# Patient Record
Sex: Female | Born: 1990 | Race: Black or African American | Hispanic: No | Marital: Single | Smoking: Current some day smoker
Health system: Southern US, Community
[De-identification: ages and names within clinical notes are randomized; demographics above are authoritative.]

## PROBLEM LIST (undated history)

## (undated) ENCOUNTER — Inpatient Hospital Stay (HOSPITAL_COMMUNITY): Payer: Self-pay

## (undated) DIAGNOSIS — R87629 Unspecified abnormal cytological findings in specimens from vagina: Secondary | ICD-10-CM

## (undated) DIAGNOSIS — D649 Anemia, unspecified: Secondary | ICD-10-CM

## (undated) DIAGNOSIS — IMO0002 Reserved for concepts with insufficient information to code with codable children: Secondary | ICD-10-CM

## (undated) DIAGNOSIS — O0930 Supervision of pregnancy with insufficient antenatal care, unspecified trimester: Secondary | ICD-10-CM

## (undated) DIAGNOSIS — R87619 Unspecified abnormal cytological findings in specimens from cervix uteri: Secondary | ICD-10-CM

## (undated) DIAGNOSIS — Z87440 Personal history of urinary (tract) infections: Secondary | ICD-10-CM

## (undated) DIAGNOSIS — O2240 Hemorrhoids in pregnancy, unspecified trimester: Secondary | ICD-10-CM

## (undated) HISTORY — PX: NO PAST SURGERIES: SHX2092

## (undated) HISTORY — DX: Unspecified abnormal cytological findings in specimens from cervix uteri: R87.619

## (undated) HISTORY — DX: Hemorrhoids in pregnancy, unspecified trimester: O22.40

## (undated) HISTORY — DX: Supervision of pregnancy with insufficient antenatal care, unspecified trimester: O09.30

## (undated) HISTORY — DX: Anemia, unspecified: D64.9

## (undated) HISTORY — DX: Reserved for concepts with insufficient information to code with codable children: IMO0002

## (undated) HISTORY — DX: Unspecified abnormal cytological findings in specimens from vagina: R87.629

## (undated) HISTORY — DX: Personal history of urinary (tract) infections: Z87.440

---
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
## 2010-01-02 ENCOUNTER — Inpatient Hospital Stay (HOSPITAL_COMMUNITY): Admission: AD | Admit: 2010-01-02 | Discharge: 2010-01-04 | Payer: Self-pay | Admitting: Obstetrics & Gynecology

## 2010-01-02 ENCOUNTER — Ambulatory Visit: Payer: Self-pay | Admitting: Obstetrics and Gynecology

## 2010-08-27 LAB — URINALYSIS, DIPSTICK ONLY
Bilirubin Urine: NEGATIVE
Ketones, ur: NEGATIVE mg/dL
Nitrite: POSITIVE — AB
Specific Gravity, Urine: 1.015 (ref 1.005–1.030)
pH: 6 (ref 5.0–8.0)

## 2010-08-27 LAB — CBC
MCH: 26.3 pg (ref 26.0–34.0)
MCHC: 32.4 g/dL (ref 30.0–36.0)
MCV: 81.2 fL (ref 78.0–100.0)
Platelets: 264 10*3/uL (ref 150–400)

## 2010-08-27 LAB — RAPID URINE DRUG SCREEN, HOSP PERFORMED
Cocaine: NOT DETECTED
Tetrahydrocannabinol: POSITIVE — AB

## 2010-08-27 LAB — RPR: RPR Ser Ql: NONREACTIVE

## 2010-08-27 LAB — URINE CULTURE
Colony Count: NO GROWTH
Special Requests: NEGATIVE

## 2010-12-26 ENCOUNTER — Inpatient Hospital Stay (INDEPENDENT_AMBULATORY_CARE_PROVIDER_SITE_OTHER)
Admission: RE | Admit: 2010-12-26 | Discharge: 2010-12-26 | Disposition: A | Discharge status: Home / Self Care | Payer: Medicaid Other | Source: Ambulatory Visit | Attending: Family Medicine | Admitting: Family Medicine

## 2010-12-26 DIAGNOSIS — H109 Unspecified conjunctivitis: Secondary | ICD-10-CM

## 2012-06-12 NOTE — L&D Delivery Note (Signed)
Delivery Note At 4:41 AM a viable female was delivered via  (Presentation:ROA ;  ).  APGAR:7/9, ; weight pending Placenta status: intact with 3VC .  Cord:  with the following complications: .    Anesthesia: Epidural  Episiotomy: none Lacerations: none Suture Repair: n/a Est. Blood Loss (mL):200   Mom to postpartum.  Baby to nursery-stable.  CRESENZO-DISHMAN,Amaru Burroughs 03/07/2013, 4:49 AM

## 2012-06-12 NOTE — L&D Delivery Note (Signed)
Attestation of Attending Supervision of Advanced Practitioner (PA/CNM/NP): Evaluation and management procedures were performed by the Advanced Practitioner under my supervision and collaboration.  I have reviewed the Advanced Practitioner's note and chart, and I agree with the management and plan.  Lahela Woodin, MD, FACOG Attending Obstetrician & Gynecologist Faculty Practice, [facility]  

## 2012-08-20 ENCOUNTER — Other Ambulatory Visit (INDEPENDENT_AMBULATORY_CARE_PROVIDER_SITE_OTHER): Payer: Medicaid Other

## 2012-08-20 DIAGNOSIS — Z3201 Encounter for pregnancy test, result positive: Secondary | ICD-10-CM

## 2012-08-20 NOTE — Addendum Note (Signed)
Addended by: Franchot Mimes on: 08/20/2012 12:18 PM   Modules accepted: Orders

## 2012-08-20 NOTE — Progress Notes (Signed)
Patient came in for a pregnancy test.  Test came back positive.  Patient went to lab to get new OB labs drawn.

## 2012-08-21 LAB — OBSTETRIC PANEL
Basophils Relative: 0 % (ref 0–1)
Eosinophils Relative: 1 % (ref 0–5)
HCT: 36.8 % (ref 36.0–46.0)
MCV: 81.2 fL (ref 78.0–100.0)
Monocytes Absolute: 0.5 10*3/uL (ref 0.1–1.0)
Neutro Abs: 5.4 10*3/uL (ref 1.7–7.7)
Neutrophils Relative %: 65 % (ref 43–77)
Platelets: 267 10*3/uL (ref 150–400)

## 2012-08-22 LAB — HEMOGLOBINOPATHY EVALUATION
Hgb A: 97.4 % (ref 96.8–97.8)
Hgb S Quant: 0 %

## 2012-09-24 ENCOUNTER — Encounter: Payer: Medicaid Other | Admitting: Obstetrics & Gynecology

## 2012-11-07 ENCOUNTER — Other Ambulatory Visit (HOSPITAL_COMMUNITY): Payer: Self-pay | Admitting: Family

## 2012-11-07 DIAGNOSIS — Z3689 Encounter for other specified antenatal screening: Secondary | ICD-10-CM

## 2012-11-11 ENCOUNTER — Encounter: Payer: Medicaid Other | Admitting: Obstetrics and Gynecology

## 2012-11-13 ENCOUNTER — Ambulatory Visit (HOSPITAL_COMMUNITY): Payer: Medicaid Other

## 2012-11-13 ENCOUNTER — Ambulatory Visit (HOSPITAL_COMMUNITY)
Admission: RE | Admit: 2012-11-13 | Discharge: 2012-11-13 | Disposition: A | Discharge status: Home / Self Care | Payer: Medicaid Other | Source: Ambulatory Visit | Attending: Family | Admitting: Family

## 2012-11-13 DIAGNOSIS — Z3689 Encounter for other specified antenatal screening: Secondary | ICD-10-CM

## 2012-11-13 DIAGNOSIS — Z1389 Encounter for screening for other disorder: Secondary | ICD-10-CM | POA: Insufficient documentation

## 2012-11-13 DIAGNOSIS — Z363 Encounter for antenatal screening for malformations: Secondary | ICD-10-CM | POA: Insufficient documentation

## 2012-11-13 DIAGNOSIS — O358XX0 Maternal care for other (suspected) fetal abnormality and damage, not applicable or unspecified: Secondary | ICD-10-CM | POA: Insufficient documentation

## 2013-02-09 ENCOUNTER — Inpatient Hospital Stay (HOSPITAL_COMMUNITY)
Admission: AD | Admit: 2013-02-09 | Discharge: 2013-02-10 | Disposition: A | Discharge status: Home / Self Care | Payer: Medicaid Other | Source: Ambulatory Visit | Attending: Obstetrics & Gynecology | Admitting: Obstetrics & Gynecology

## 2013-02-09 ENCOUNTER — Encounter (HOSPITAL_COMMUNITY): Payer: Self-pay | Admitting: *Deleted

## 2013-02-09 DIAGNOSIS — R42 Dizziness and giddiness: Secondary | ICD-10-CM | POA: Insufficient documentation

## 2013-02-09 DIAGNOSIS — O479 False labor, unspecified: Secondary | ICD-10-CM | POA: Insufficient documentation

## 2013-02-09 DIAGNOSIS — O99891 Other specified diseases and conditions complicating pregnancy: Secondary | ICD-10-CM | POA: Insufficient documentation

## 2013-02-09 LAB — URINALYSIS, ROUTINE W REFLEX MICROSCOPIC
Hgb urine dipstick: NEGATIVE
Ketones, ur: NEGATIVE mg/dL
Leukocytes, UA: NEGATIVE
Protein, ur: NEGATIVE mg/dL
pH: 6.5 (ref 5.0–8.0)

## 2013-02-09 MED ORDER — ONDANSETRON 4 MG PO TBDP
4.0000 mg | ORAL_TABLET | Freq: Once | ORAL | Status: AC
  Administered 2013-02-10: 4 mg via ORAL
  Filled 2013-02-09: qty 1

## 2013-02-09 NOTE — MAU Note (Signed)
In pool today and felt something warm, now has Nausea (dry heaves) and dizziness, to Triage via wheelchair Good fetal movement

## 2013-02-09 NOTE — MAU Provider Note (Cosign Needed)
History     CSN: [id number]  Arrival date and time: 02/09/13 2244   None     Chief Complaint  Patient presents with  . Labor Eval  . Rupture of Membranes    evaluation   HPI Courtney Reese is a 22 y.o. G3P2002 at [redacted]w[redacted]d presents with complaints of getting lightheaded and nauseated after eating "lots of pizza" this evening. Pt states she was worried she would pass out and was driving and pulled over. This is the first time she has had this complaint. Since then she has noticed that she has some lower abdominal pains and pains in her bottom. Pt states that she has hemorrhoids and the pain in her bottom is not new. Pt states that she otherwise is in her usual state of health.   Something warm in pool from nurses note clarified pt felt something warm between her legs while playing in sprinklers. Has not had any leakage of fluid since that time. Has not had damp underwear or other issues with fluid. Pt states she IS NOT LEAKING FLUID.   OB History   Grav Para Term Preterm Abortions TAB SAB Ect Mult Living   3 2 2       2       No past medical history on file.  No past surgical history on file.  No family history on file.  History  Substance Use Topics  . Smoking status: Not on file  . Smokeless tobacco: Not on file  . Alcohol Use: Not on file    Allergies: Allergies not on file  No prescriptions prior to admission    Review of Systems  Constitutional: Negative for fever, chills and diaphoresis.  Eyes: Negative for blurred vision and double vision.  Respiratory: Negative for sputum production and shortness of breath.   Cardiovascular: Negative for chest pain.  Gastrointestinal: Positive for nausea and constipation. Negative for heartburn, vomiting, abdominal pain and diarrhea.  Neurological: Positive for dizziness. Negative for tingling, tremors, weakness and headaches.   Physical Exam   Blood pressure 116/66, pulse 92, temperature 98.2 F (36.8 C), resp. rate 20,  last menstrual period 05/21/2012.  Physical Exam  Nursing note and vitals reviewed. Constitutional: She appears well-developed and well-nourished. No distress.  HENT:  Head: Normocephalic and atraumatic.  Eyes: Conjunctivae and EOM are normal.  Neck: Normal range of motion. Neck supple.  Cardiovascular: Normal rate, regular rhythm, normal heart sounds and intact distal pulses.  Exam reveals no gallop and no friction rub.   No murmur heard. Respiratory: Effort normal and breath sounds normal. No respiratory distress. She has no wheezes. She has no rales. She exhibits no tenderness.  GI: Soft. Bowel sounds are normal. She exhibits no distension and no mass. There is no tenderness. There is no rebound and no guarding.  Genitourinary:  Declined pelivic exam   Skin: She is not diaphoretic.    FHT: 130s mod variability mult accels, no decels  Toco: q15 +min  MAU Course  Procedures  MDM Pt currently nauseated and light headed. Will eval with orthostatics and give zofran to see if can improve nausea and further eval light headedness.  Assessment and Plan  PORSHE FLEAGLE is a 22 y.o. G3P2002 at [redacted]w[redacted]d here for evaluation of light headedness. Negative orthostatics. Resolution of light headedness with Zofran. Tolerating PO. Will discharge home. Reassuring NST and pt desires to go home. Pt declined SVE. Contractions mild and not signficant. Given return precautions for labor.   Courtney Reese 02/09/2013, 11:50 PM

## 2013-02-10 DIAGNOSIS — R42 Dizziness and giddiness: Secondary | ICD-10-CM

## 2013-02-17 LAB — OB RESULTS CONSOLE GC/CHLAMYDIA: Chlamydia: NEGATIVE

## 2013-02-19 LAB — OB RESULTS CONSOLE GBS: GBS: NEGATIVE

## 2013-02-27 ENCOUNTER — Other Ambulatory Visit (HOSPITAL_COMMUNITY): Payer: Self-pay | Admitting: Physician Assistant

## 2013-02-27 DIAGNOSIS — O480 Post-term pregnancy: Secondary | ICD-10-CM

## 2013-03-04 ENCOUNTER — Ambulatory Visit (HOSPITAL_COMMUNITY): Admission: RE | Admit: 2013-03-04 | Payer: Medicaid Other | Source: Ambulatory Visit

## 2013-03-04 ENCOUNTER — Ambulatory Visit (HOSPITAL_COMMUNITY)
Admission: RE | Admit: 2013-03-04 | Discharge: 2013-03-04 | Disposition: A | Discharge status: Home / Self Care | Payer: Medicaid Other | Source: Ambulatory Visit | Attending: Physician Assistant | Admitting: Physician Assistant

## 2013-03-04 ENCOUNTER — Other Ambulatory Visit (HOSPITAL_COMMUNITY): Payer: Self-pay | Admitting: Physician Assistant

## 2013-03-04 DIAGNOSIS — O480 Post-term pregnancy: Secondary | ICD-10-CM

## 2013-03-04 DIAGNOSIS — Z3689 Encounter for other specified antenatal screening: Secondary | ICD-10-CM | POA: Insufficient documentation

## 2013-03-05 ENCOUNTER — Encounter (HOSPITAL_COMMUNITY): Payer: Self-pay | Admitting: *Deleted

## 2013-03-05 ENCOUNTER — Telehealth (HOSPITAL_COMMUNITY): Payer: Self-pay | Admitting: *Deleted

## 2013-03-05 ENCOUNTER — Other Ambulatory Visit (HOSPITAL_COMMUNITY): Payer: Self-pay | Admitting: Physician Assistant

## 2013-03-05 DIAGNOSIS — O480 Post-term pregnancy: Secondary | ICD-10-CM

## 2013-03-05 NOTE — Telephone Encounter (Signed)
Preadmission screen  

## 2013-03-06 ENCOUNTER — Encounter (HOSPITAL_COMMUNITY): Payer: Self-pay | Admitting: *Deleted

## 2013-03-06 ENCOUNTER — Inpatient Hospital Stay (HOSPITAL_COMMUNITY)
Admission: AD | Admit: 2013-03-06 | Discharge: 2013-03-09 | DRG: 775 | Disposition: A | Discharge status: Home / Self Care | Payer: Medicaid Other | Source: Ambulatory Visit | Attending: Obstetrics & Gynecology | Admitting: Obstetrics & Gynecology

## 2013-03-06 DIAGNOSIS — IMO0001 Reserved for inherently not codable concepts without codable children: Secondary | ICD-10-CM

## 2013-03-06 DIAGNOSIS — O480 Post-term pregnancy: Principal | ICD-10-CM | POA: Diagnosis present

## 2013-03-06 LAB — CBC
HCT: 33.4 % — ABNORMAL LOW (ref 36.0–46.0)
Hemoglobin: 10.9 g/dL — ABNORMAL LOW (ref 12.0–15.0)
MCV: 79 fL (ref 78.0–100.0)
RDW: 14.2 % (ref 11.5–15.5)
WBC: 9.6 10*3/uL (ref 4.0–10.5)

## 2013-03-06 MED ORDER — OXYTOCIN 40 UNITS IN LACTATED RINGERS INFUSION - SIMPLE MED
62.5000 mL/h | INTRAVENOUS | Status: DC
  Filled 2013-03-06: qty 1000

## 2013-03-06 MED ORDER — ACETAMINOPHEN 325 MG PO TABS: 650.0000 mg | ORAL_TABLET | ORAL | Status: DC | PRN

## 2013-03-06 MED ORDER — EPHEDRINE 5 MG/ML INJ
10.0000 mg | INTRAVENOUS | Status: DC | PRN
  Filled 2013-03-06: qty 2
  Filled 2013-03-06: qty 4

## 2013-03-06 MED ORDER — LACTATED RINGERS IV SOLN: 500.0000 mL | INTRAVENOUS | Status: DC | PRN

## 2013-03-06 MED ORDER — IBUPROFEN 600 MG PO TABS
600.0000 mg | ORAL_TABLET | Freq: Four times a day (QID) | ORAL | Status: DC | PRN
  Administered 2013-03-07: 600 mg via ORAL
  Filled 2013-03-06: qty 1

## 2013-03-06 MED ORDER — LACTATED RINGERS IV SOLN: 500.0000 mL | Freq: Once | INTRAVENOUS | Status: DC

## 2013-03-06 MED ORDER — DIPHENHYDRAMINE HCL 50 MG/ML IJ SOLN
12.5000 mg | INTRAMUSCULAR | Status: DC | PRN
  Administered 2013-03-07 (×2): 12.5 mg via INTRAVENOUS
  Filled 2013-03-06 (×2): qty 1

## 2013-03-06 MED ORDER — PHENYLEPHRINE 40 MCG/ML (10ML) SYRINGE FOR IV PUSH (FOR BLOOD PRESSURE SUPPORT)
80.0000 ug | PREFILLED_SYRINGE | INTRAVENOUS | Status: DC | PRN
  Filled 2013-03-06: qty 5
  Filled 2013-03-06: qty 2

## 2013-03-06 MED ORDER — FLEET ENEMA 7-19 GM/118ML RE ENEM: 1.0000 | ENEMA | RECTAL | Status: DC | PRN

## 2013-03-06 MED ORDER — PHENYLEPHRINE 40 MCG/ML (10ML) SYRINGE FOR IV PUSH (FOR BLOOD PRESSURE SUPPORT)
80.0000 ug | PREFILLED_SYRINGE | INTRAVENOUS | Status: DC | PRN
  Filled 2013-03-06: qty 2

## 2013-03-06 MED ORDER — ONDANSETRON HCL 4 MG/2ML IJ SOLN: 4.0000 mg | Freq: Four times a day (QID) | INTRAMUSCULAR | Status: DC | PRN

## 2013-03-06 MED ORDER — EPHEDRINE 5 MG/ML INJ
10.0000 mg | INTRAVENOUS | Status: DC | PRN
  Filled 2013-03-06: qty 2

## 2013-03-06 MED ORDER — CITRIC ACID-SODIUM CITRATE 334-500 MG/5ML PO SOLN: 30.0000 mL | ORAL | Status: DC | PRN

## 2013-03-06 MED ORDER — LACTATED RINGERS IV SOLN
INTRAVENOUS | Status: DC
  Administered 2013-03-06 – 2013-03-07 (×2): via INTRAVENOUS

## 2013-03-06 MED ORDER — OXYCODONE-ACETAMINOPHEN 5-325 MG PO TABS: 1.0000 | ORAL_TABLET | ORAL | Status: DC | PRN

## 2013-03-06 MED ORDER — FENTANYL CITRATE 0.05 MG/ML IJ SOLN: 50.0000 ug | INTRAMUSCULAR | Status: DC | PRN

## 2013-03-06 MED ORDER — FENTANYL 2.5 MCG/ML BUPIVACAINE 1/10 % EPIDURAL INFUSION (WH - ANES)
14.0000 mL/h | INTRAMUSCULAR | Status: DC | PRN
  Administered 2013-03-07: 14 mL/h via EPIDURAL
  Filled 2013-03-06: qty 125

## 2013-03-06 MED ORDER — OXYTOCIN BOLUS FROM INFUSION: 500.0000 mL | INTRAVENOUS | Status: DC

## 2013-03-06 MED ORDER — LIDOCAINE HCL (PF) 1 % IJ SOLN
30.0000 mL | INTRAMUSCULAR | Status: DC | PRN
  Filled 2013-03-06 (×2): qty 30

## 2013-03-06 NOTE — H&P (Signed)
Courtney Reese is a 22 y.o. female G29P2002 with IUP at [redacted]w[redacted]d presenting for contractions. Pt states she has been having irregular, every 8 minutes contractions, associated with none vaginal bleeding.  Membranes are intact, with active fetal movement.   [facility]  Prenatal History/Complications: Late [facility] Past Medical History: Past Medical History  Diagnosis Date  . Hemorrhoids in pregnancy   . Hx: UTI (urinary tract infection)   . Anemia   . 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 Late prenatal care     Past Surgical History: Past Surgical History  Procedure Laterality Date  . No past surgeries      Obstetrical History: OB History   Grav Para Term Preterm Abortions TAB SAB Ect Mult Living   3 2 2       2         Social History: History   Social History  . Marital Status: Single    Spouse Name: N/A    Number of Children: N/A  . Years of Education: N/A   Social History Main Topics  . Smoking status: Former Smoker    Quit date: 10/22/2012  . Smokeless tobacco: None  . Alcohol Use: No  . Drug Use: No  . Sexual Activity: None   Other Topics Concern  . None   Social History Narrative  . None    Family History: No family history on file.  Allergies: No Known Allergies  Prescriptions prior to admission  Medication Sig Dispense Refill  . Prenatal Vit-Fe Fumarate-FA (PRENATAL MULTIVITAMIN) TABS tablet Take 1 tablet by mouth daily at 12 noon.         Review of Systems   Constitutional: Negative for fever, chills, weight loss, malaise/fatigue and diaphoresis.  HENT: Negative for hearing loss, ear pain, nosebleeds, congestion, sore throat, neck pain, tinnitus and ear discharge.   Eyes: Negative for blurred vision, double vision, photophobia, pain, discharge and redness.  Respiratory: Negative for cough, hemoptysis, sputum production, shortness of breath, wheezing and stridor.   Cardiovascular: Negative for chest pain,  palpitations, orthopnea,  leg swelling  Gastrointestinal: Positive for abdominal pain. Negative for heartburn, nausea, vomiting, diarrhea, constipation, blood in stool Genitourinary: Negative for dysuria, urgency, frequency, hematuria and flank pain.  Musculoskeletal: Negative for myalgias, back pain, joint pain and falls.  Skin: Negative for itching and rash.  Neurological: Negative for dizziness, tingling, tremors, sensory change, speech change, focal weakness, seizures, loss of consciousness, weakness and headaches.  Endo/Heme/Allergies: Negative for environmental allergies and polydipsia. Does not bruise/bleed easily.  Psychiatric/Behavioral: Negative for depression, suicidal ideas, hallucinations, memory loss and substance abuse. The patient is not nervous/anxious and does not have insomnia.       Blood pressure 108/68, pulse 92, temperature 98 F (36.7 C), temperature source Oral, resp. rate 20, height 5\' 1"  (1.549 m), weight 67.756 kg (149 lb 6 oz), last menstrual period 05/21/2012. General appearance: alert, cooperative and no distress Lungs: clear to auscultation bilaterally Heart: regular rate and rhythm Abdomen: soft, non-tender; bowel sounds normal Extremities: Homans sign is negative, no sign of DVT DTR's 2+ Presentation: cephalic Fetal monitoringBaseline: 140 bpm, Variability: Good {> 6 bpm), Accelerations: Reactive and Decelerations: Absent Uterine activity  Were 8-10 minutes and mild when first arrived. After 2 hours of obs, contractions have increased to q 4-5 minutes, stronger  Dilation: 4 Effacement (%): 70 Station: -2 Exam by:: DHarris RN   Prenatal labs: ABO, Rh: B/POS/-- (03/11 1223) Antibody:  NEG (03/11 1223) Rubella:  immune RPR: NON REAC (03/11 1223)  HBsAg: NEGATIVE (03/11 1223)  HIV: NON REACTIVE (03/11 1223)  GBS: Negative (09/10 0000)  1 hr Glucola 64 Genetic screening  Too late Anatomy US normal     Assessment: Courtney Reese is a 22 y.o.  [phone] with an IUP at [redacted]w[redacted]d presenting for early labor/postdates  Plan: Admit   CRESENZO-DISHMAN,Monisha Siebel 03/06/2013, 11:09 PM

## 2013-03-06 NOTE — MAU Note (Signed)
PT SAYS   IS AN INDUCTION AT 0630.    VE  IN  OFFICE AT HD- 4 CM.   HURT BAD  SINCE  3PM.     DENIES HSV AND RMSA.

## 2013-03-07 ENCOUNTER — Encounter (HOSPITAL_COMMUNITY): Payer: Self-pay | Admitting: *Deleted

## 2013-03-07 ENCOUNTER — Inpatient Hospital Stay (HOSPITAL_COMMUNITY): Admission: RE | Admit: 2013-03-07 | Payer: Medicaid Other | Source: Ambulatory Visit

## 2013-03-07 ENCOUNTER — Inpatient Hospital Stay (HOSPITAL_COMMUNITY): Payer: Medicaid Other | Admitting: Anesthesiology

## 2013-03-07 ENCOUNTER — Encounter (HOSPITAL_COMMUNITY): Payer: Self-pay | Admitting: Anesthesiology

## 2013-03-07 DIAGNOSIS — O480 Post-term pregnancy: Secondary | ICD-10-CM

## 2013-03-07 LAB — RPR: RPR Ser Ql: NONREACTIVE

## 2013-03-07 MED ORDER — WITCH HAZEL-GLYCERIN EX PADS: 1.0000 "application " | MEDICATED_PAD | CUTANEOUS | Status: DC | PRN

## 2013-03-07 MED ORDER — MEASLES, MUMPS & RUBELLA VAC ~~LOC~~ INJ
0.5000 mL | INJECTION | Freq: Once | SUBCUTANEOUS | Status: DC
  Filled 2013-03-07: qty 0.5

## 2013-03-07 MED ORDER — BENZOCAINE-MENTHOL 20-0.5 % EX AERO
1.0000 "application " | INHALATION_SPRAY | CUTANEOUS | Status: DC | PRN
  Filled 2013-03-07: qty 56

## 2013-03-07 MED ORDER — DIPHENHYDRAMINE HCL 25 MG PO CAPS: 25.0000 mg | ORAL_CAPSULE | Freq: Four times a day (QID) | ORAL | Status: DC | PRN

## 2013-03-07 MED ORDER — SIMETHICONE 80 MG PO CHEW: 80.0000 mg | CHEWABLE_TABLET | ORAL | Status: DC | PRN

## 2013-03-07 MED ORDER — FLEET ENEMA 7-19 GM/118ML RE ENEM: 1.0000 | ENEMA | Freq: Every day | RECTAL | Status: DC | PRN

## 2013-03-07 MED ORDER — LANOLIN HYDROUS EX OINT: TOPICAL_OINTMENT | CUTANEOUS | Status: DC | PRN

## 2013-03-07 MED ORDER — TETANUS-DIPHTH-ACELL PERTUSSIS 5-2.5-18.5 LF-MCG/0.5 IM SUSP: 0.5000 mL | Freq: Once | INTRAMUSCULAR | Status: DC

## 2013-03-07 MED ORDER — SODIUM BICARBONATE 8.4 % IV SOLN
INTRAVENOUS | Status: DC | PRN
  Administered 2013-03-07: 5 mL via EPIDURAL

## 2013-03-07 MED ORDER — SENNOSIDES-DOCUSATE SODIUM 8.6-50 MG PO TABS
2.0000 | ORAL_TABLET | Freq: Every day | ORAL | Status: DC
  Administered 2013-03-08: 2 via ORAL

## 2013-03-07 MED ORDER — OXYCODONE-ACETAMINOPHEN 5-325 MG PO TABS
1.0000 | ORAL_TABLET | ORAL | Status: DC | PRN
  Administered 2013-03-07 – 2013-03-08 (×3): 1 via ORAL
  Filled 2013-03-07 (×2): qty 1
  Filled 2013-03-07: qty 2

## 2013-03-07 MED ORDER — IBUPROFEN 600 MG PO TABS
600.0000 mg | ORAL_TABLET | Freq: Four times a day (QID) | ORAL | Status: DC
  Administered 2013-03-07 – 2013-03-09 (×8): 600 mg via ORAL
  Filled 2013-03-07 (×8): qty 1

## 2013-03-07 MED ORDER — ZOLPIDEM TARTRATE 5 MG PO TABS: 5.0000 mg | ORAL_TABLET | Freq: Every evening | ORAL | Status: DC | PRN

## 2013-03-07 MED ORDER — ONDANSETRON HCL 4 MG/2ML IJ SOLN: 4.0000 mg | INTRAMUSCULAR | Status: DC | PRN

## 2013-03-07 MED ORDER — METHYLERGONOVINE MALEATE 0.2 MG/ML IJ SOLN: 0.2000 mg | INTRAMUSCULAR | Status: DC | PRN

## 2013-03-07 MED ORDER — METHYLERGONOVINE MALEATE 0.2 MG PO TABS: 0.2000 mg | ORAL_TABLET | ORAL | Status: DC | PRN

## 2013-03-07 MED ORDER — BISACODYL 10 MG RE SUPP: 10.0000 mg | Freq: Every day | RECTAL | Status: DC | PRN

## 2013-03-07 MED ORDER — PRENATAL MULTIVITAMIN CH
1.0000 | ORAL_TABLET | Freq: Every day | ORAL | Status: DC
  Administered 2013-03-07 – 2013-03-08 (×2): 1 via ORAL
  Filled 2013-03-07 (×2): qty 1

## 2013-03-07 MED ORDER — ONDANSETRON HCL 4 MG PO TABS: 4.0000 mg | ORAL_TABLET | ORAL | Status: DC | PRN

## 2013-03-07 MED ORDER — DIBUCAINE 1 % RE OINT
1.0000 "application " | TOPICAL_OINTMENT | RECTAL | Status: DC | PRN
  Filled 2013-03-07: qty 28

## 2013-03-07 MED ORDER — OXYTOCIN 40 UNITS IN LACTATED RINGERS INFUSION - SIMPLE MED: 62.5000 mL/h | INTRAVENOUS | Status: DC | PRN

## 2013-03-07 MED ORDER — FERROUS SULFATE 325 (65 FE) MG PO TABS
325.0000 mg | ORAL_TABLET | Freq: Two times a day (BID) | ORAL | Status: DC
  Administered 2013-03-07 – 2013-03-09 (×5): 325 mg via ORAL
  Filled 2013-03-07 (×5): qty 1

## 2013-03-07 NOTE — Progress Notes (Signed)
   Courtney Reese is a 22 y.o. G3P2002 at [redacted]w[redacted]d  admitted for active labor  Subjective: Comfortable with epidural. No pressure  Objective: BP 113/74  Pulse 89  Temp(Src) 98.2 F (36.8 C) (Oral)  Resp 18  Ht 5\' 1"  (1.549 m)  Wt 67.756 kg (149 lb 6 oz)  BMI 28.24 kg/m2  SpO2 99%  LMP 05/21/2012    FHT:  FHR: 140 bpm, variability: moderate,  accelerations:  Present,  decelerations:  Absent UC:   regular, every 2-3 minutes SVE:   Dilation: 9 Effacement (%): 100 Station: 0 Exam by:: S Nix RN AROM with clear fluid  Labs: Lab Results  Component Value Date   WBC 9.6 03/06/2013   HGB 10.9* 03/06/2013   HCT 33.4* 03/06/2013   MCV 79.0 03/06/2013   PLT 175 03/06/2013    Assessment / Plan: Spontaneous labor, progressing normally  Labor: Progressing normally Fetal Wellbeing:  Category I Pain Control:  Epidural Anticipated MOD:  NSVD  CRESENZO-DISHMAN,Raelene Trew 03/07/2013, 2:27 AM

## 2013-03-07 NOTE — Anesthesia Preprocedure Evaluation (Signed)

## 2013-03-07 NOTE — Anesthesia Postprocedure Evaluation (Signed)
Anesthesia Post Note  Patient: Courtney Reese  Procedure(s) Performed: * No procedures listed *  Anesthesia type: Epidural  Patient location: Mother/Baby  Post pain: Pain level controlled  Post assessment: Post-op Vital signs reviewed  Last Vitals:  Filed Vitals:   03/07/13 0630  BP: 111/65  Pulse: 72  Temp: 36.7 C  Resp: 20    Post vital signs: Reviewed  Level of consciousness: awake  Complications: No apparent anesthesia complications

## 2013-03-07 NOTE — Progress Notes (Signed)
UR completed 

## 2013-03-07 NOTE — Anesthesia Procedure Notes (Signed)

## 2013-03-08 NOTE — Progress Notes (Signed)
Post Partum Day 1 Subjective: no complaints, up ad lib, voiding and tolerating PO  Objective: Blood pressure 101/64, pulse 63, temperature 98.2 F (36.8 C), temperature source Oral, resp. rate 18, height 5\' 1"  (1.549 m), weight 67.756 kg (149 lb 6 oz), last menstrual period 05/21/2012, SpO2 98.00%, unknown if currently breastfeeding.  Physical Exam:  General: alert, cooperative and no distress Lochia: appropriate Uterine Fundus: firm Incision: na DVT Evaluation: No evidence of DVT seen on physical exam. No significant calf/ankle edema.   Recent Labs  03/06/13 2330  HGB 10.9*  HCT 33.4*    Assessment/Plan: Plan for discharge tomorrow, Breastfeeding and Contraception plans for mirena   LOS: 2 days   Bill Mcvey L 03/08/2013, 7:52 AM

## 2013-03-09 MED ORDER — IBUPROFEN 600 MG PO TABS: 600.0000 mg | ORAL_TABLET | Freq: Four times a day (QID) | ORAL | Status: DC

## 2013-03-09 MED ORDER — OXYCODONE-ACETAMINOPHEN 5-325 MG PO TABS: 1.0000 | ORAL_TABLET | ORAL | Status: DC | PRN

## 2013-03-09 NOTE — Discharge Summary (Signed)
Attestation of Attending Supervision of Advanced Practitioner: Evaluation and management procedures were performed by the PA/NP/CNM/OB Fellow under my supervision/collaboration. Chart reviewed and agree with management and plan.  Tilda Burrow 03/09/2013 6:38 PM

## 2013-03-09 NOTE — Discharge Summary (Signed)
Obstetric Discharge Summary Reason for Admission: onset of labor Prenatal Procedures: ultrasound Intrapartum Procedures: spontaneous vaginal delivery Postpartum Procedures: none Complications-Operative and Postpartum: none Hemoglobin  Date Value Range Status  03/06/2013 10.9* 12.0 - 15.0 g/dL Final     HCT  Date Value Range Status  03/06/2013 33.4* 36.0 - 46.0 % Final    Physical Exam:  General: alert, cooperative, appears stated age and no distress Lochia: appropriate Uterine Fundus: firm Incision: n/a DVT Evaluation: No evidence of DVT seen on physical exam. Negative Homan's sign. No cords or calf tenderness.  Discharge Diagnoses: Term Pregnancy-delivered  Discharge Information: Date: 03/09/2013 Activity: pelvic rest Diet: routine Medications: PNV, Ibuprofen and Percocet Condition: stable Instructions: refer to practice specific booklet Discharge to: home   Newborn Data: Live born female  Birth Weight: 8 lb (3630 g) APGAR: 7, 9  Home with mother.  Wyvonnia Dusky DARLENE 03/09/2013, 8:51 AM

## 2013-03-10 NOTE — H&P (Signed)
Attestation of Attending Supervision of Advanced Practitioner (PA/CNM/NP): Evaluation and management procedures were performed by the Advanced Practitioner under my supervision and collaboration.  I have reviewed the Advanced Practitioner's note and chart, and I agree with the management and plan.  Bayden Gil, MD, FACOG Attending Obstetrician & Gynecologist Faculty Practice, [facility]  

## 2014-03-16 ENCOUNTER — Other Ambulatory Visit (HOSPITAL_COMMUNITY): Payer: Self-pay | Admitting: Nurse Practitioner

## 2014-03-16 DIAGNOSIS — Z3689 Encounter for other specified antenatal screening: Secondary | ICD-10-CM

## 2014-03-16 LAB — OB RESULTS CONSOLE GC/CHLAMYDIA
Chlamydia: NEGATIVE
Gonorrhea: NEGATIVE

## 2014-03-16 LAB — OB RESULTS CONSOLE RPR: RPR: NONREACTIVE

## 2014-03-16 LAB — OB RESULTS CONSOLE ABO/RH: RH Type: POSITIVE

## 2014-03-16 LAB — OB RESULTS CONSOLE HIV ANTIBODY (ROUTINE TESTING): HIV: NONREACTIVE

## 2014-03-16 LAB — OB RESULTS CONSOLE HEPATITIS B SURFACE ANTIGEN: HEP B S AG: NEGATIVE

## 2014-03-16 LAB — OB RESULTS CONSOLE RUBELLA ANTIBODY, IGM: RUBELLA: IMMUNE

## 2014-03-16 LAB — OB RESULTS CONSOLE ANTIBODY SCREEN: Antibody Screen: NEGATIVE

## 2014-03-23 ENCOUNTER — Ambulatory Visit (HOSPITAL_COMMUNITY)
Admission: RE | Admit: 2014-03-23 | Discharge: 2014-03-23 | Disposition: A | Discharge status: Home / Self Care | Payer: Medicaid Other | Source: Ambulatory Visit | Attending: Nurse Practitioner | Admitting: Nurse Practitioner

## 2014-03-23 DIAGNOSIS — Z3A22 22 weeks gestation of pregnancy: Secondary | ICD-10-CM | POA: Insufficient documentation

## 2014-03-23 DIAGNOSIS — Z36 Encounter for antenatal screening of mother: Secondary | ICD-10-CM | POA: Diagnosis not present

## 2014-03-23 DIAGNOSIS — Z3689 Encounter for other specified antenatal screening: Secondary | ICD-10-CM

## 2014-04-13 ENCOUNTER — Encounter (HOSPITAL_COMMUNITY): Payer: Self-pay | Admitting: *Deleted

## 2014-06-12 NOTE — L&D Delivery Note (Signed)
Patient is 24 y.o. [id number] 2261w4d admitted on 08/06/14 for induction of labor for post-dates.   Delivery Note At 6:23 PM a healthy female was delivered via Vaginal, Spontaneous Delivery (Presentation: Cephalic ).  APGAR: 9, 9; weight  Pending.   Placenta status: Intact, Spontaneous.  Cord:  with the following complications: None. Cord blood collected for donation.  Anesthesia: Epidural  Episiotomy: None  Lacerations:  None  Suture Repair: N/A Est. Blood Loss (mL):  200  Mom to postpartum.  Baby to Couplet care / Skin to Skin.  Araceli BoucheRumley, [place] N 08/06/2014, 7:05 PM  I was present for the entire delivery of baby and placenta and repair and agree with above. I personally collected cord blood for donation.   [place] Anmol Paschen, [place] 08/06/2014 7:40 PM

## 2014-07-01 LAB — OB RESULTS CONSOLE GC/CHLAMYDIA
Chlamydia: NEGATIVE
Gonorrhea: NEGATIVE

## 2014-07-01 LAB — OB RESULTS CONSOLE GBS: STREP GROUP B AG: NEGATIVE

## 2014-07-30 ENCOUNTER — Other Ambulatory Visit (HOSPITAL_COMMUNITY): Payer: Self-pay | Admitting: Nurse Practitioner

## 2014-07-30 DIAGNOSIS — O480 Post-term pregnancy: Secondary | ICD-10-CM

## 2014-08-03 ENCOUNTER — Ambulatory Visit (HOSPITAL_COMMUNITY)
Admission: RE | Admit: 2014-08-03 | Discharge: 2014-08-03 | Disposition: A | Discharge status: Home / Self Care | Payer: Medicaid Other | Source: Ambulatory Visit | Attending: Nurse Practitioner | Admitting: Nurse Practitioner

## 2014-08-03 DIAGNOSIS — Z3A41 41 weeks gestation of pregnancy: Secondary | ICD-10-CM | POA: Diagnosis not present

## 2014-08-03 DIAGNOSIS — Z36 Encounter for antenatal screening of mother: Secondary | ICD-10-CM | POA: Insufficient documentation

## 2014-08-03 DIAGNOSIS — O480 Post-term pregnancy: Secondary | ICD-10-CM | POA: Insufficient documentation

## 2014-08-05 ENCOUNTER — Encounter (HOSPITAL_COMMUNITY): Payer: Self-pay | Admitting: *Deleted

## 2014-08-05 ENCOUNTER — Telehealth (HOSPITAL_COMMUNITY): Payer: Self-pay | Admitting: *Deleted

## 2014-08-05 NOTE — Telephone Encounter (Signed)
Preadmission screen  

## 2014-08-06 ENCOUNTER — Inpatient Hospital Stay (HOSPITAL_COMMUNITY): Payer: Medicaid Other | Admitting: Anesthesiology

## 2014-08-06 ENCOUNTER — Inpatient Hospital Stay (HOSPITAL_COMMUNITY)
Admission: RE | Admit: 2014-08-06 | Discharge: 2014-08-08 | DRG: 775 | Disposition: A | Discharge status: Home / Self Care | Payer: Medicaid Other | Source: Ambulatory Visit | Attending: Family Medicine | Admitting: Family Medicine

## 2014-08-06 ENCOUNTER — Encounter (HOSPITAL_COMMUNITY): Payer: Self-pay

## 2014-08-06 DIAGNOSIS — Z3A41 41 weeks gestation of pregnancy: Secondary | ICD-10-CM | POA: Diagnosis present

## 2014-08-06 DIAGNOSIS — Z8249 Family history of ischemic heart disease and other diseases of the circulatory system: Secondary | ICD-10-CM

## 2014-08-06 DIAGNOSIS — O480 Post-term pregnancy: Secondary | ICD-10-CM | POA: Diagnosis present

## 2014-08-06 DIAGNOSIS — Z833 Family history of diabetes mellitus: Secondary | ICD-10-CM

## 2014-08-06 DIAGNOSIS — Z87891 Personal history of nicotine dependence: Secondary | ICD-10-CM | POA: Diagnosis not present

## 2014-08-06 LAB — TYPE AND SCREEN
ABO/RH(D): B POS
Antibody Screen: NEGATIVE

## 2014-08-06 LAB — CBC
HEMATOCRIT: 32 % — AB (ref 36.0–46.0)
HEMOGLOBIN: 10.1 g/dL — AB (ref 12.0–15.0)
MCH: 24.9 pg — ABNORMAL LOW (ref 26.0–34.0)
MCHC: 31.6 g/dL (ref 30.0–36.0)
MCV: 78.8 fL (ref 78.0–100.0)
PLATELETS: 188 10*3/uL (ref 150–400)
RBC: 4.06 MIL/uL (ref 3.87–5.11)
RDW: 15.7 % — AB (ref 11.5–15.5)
WBC: 9.2 10*3/uL (ref 4.0–10.5)

## 2014-08-06 LAB — ABO/RH: ABO/RH(D): B POS

## 2014-08-06 LAB — RPR: RPR: NONREACTIVE

## 2014-08-06 MED ORDER — OXYCODONE-ACETAMINOPHEN 5-325 MG PO TABS
1.0000 | ORAL_TABLET | ORAL | Status: DC | PRN
  Administered 2014-08-07 (×2): 1 via ORAL
  Filled 2014-08-06 (×2): qty 1

## 2014-08-06 MED ORDER — FLEET ENEMA 7-19 GM/118ML RE ENEM: 1.0000 | ENEMA | Freq: Every day | RECTAL | Status: DC | PRN

## 2014-08-06 MED ORDER — ONDANSETRON HCL 4 MG/2ML IJ SOLN: 4.0000 mg | Freq: Four times a day (QID) | INTRAMUSCULAR | Status: DC | PRN

## 2014-08-06 MED ORDER — LACTATED RINGERS IV SOLN
INTRAVENOUS | Status: DC
  Administered 2014-08-06 (×2): via INTRAVENOUS

## 2014-08-06 MED ORDER — SIMETHICONE 80 MG PO CHEW: 80.0000 mg | CHEWABLE_TABLET | ORAL | Status: DC | PRN

## 2014-08-06 MED ORDER — ACETAMINOPHEN 325 MG PO TABS: 650.0000 mg | ORAL_TABLET | ORAL | Status: DC | PRN

## 2014-08-06 MED ORDER — LANOLIN HYDROUS EX OINT: TOPICAL_OINTMENT | CUTANEOUS | Status: DC | PRN

## 2014-08-06 MED ORDER — BENZOCAINE-MENTHOL 20-0.5 % EX AERO
1.0000 "application " | INHALATION_SPRAY | CUTANEOUS | Status: DC | PRN
  Administered 2014-08-07: 1 via TOPICAL
  Filled 2014-08-06: qty 56

## 2014-08-06 MED ORDER — OXYTOCIN 40 UNITS IN LACTATED RINGERS INFUSION - SIMPLE MED: 62.5000 mL/h | INTRAVENOUS | Status: DC

## 2014-08-06 MED ORDER — OXYTOCIN 40 UNITS IN LACTATED RINGERS INFUSION - SIMPLE MED
62.5000 mL/h | INTRAVENOUS | Status: DC | PRN
Start: 2014-08-06 — End: 2014-08-08

## 2014-08-06 MED ORDER — OXYTOCIN BOLUS FROM INFUSION
500.0000 mL | INTRAVENOUS | Status: DC
  Administered 2014-08-06: 500 mL via INTRAVENOUS

## 2014-08-06 MED ORDER — IBUPROFEN 600 MG PO TABS
600.0000 mg | ORAL_TABLET | Freq: Four times a day (QID) | ORAL | Status: DC
  Administered 2014-08-07 – 2014-08-08 (×6): 600 mg via ORAL
  Filled 2014-08-06 (×6): qty 1

## 2014-08-06 MED ORDER — FENTANYL CITRATE 0.05 MG/ML IJ SOLN: 100.0000 ug | INTRAMUSCULAR | Status: DC | PRN

## 2014-08-06 MED ORDER — SENNOSIDES-DOCUSATE SODIUM 8.6-50 MG PO TABS
2.0000 | ORAL_TABLET | ORAL | Status: DC
  Administered 2014-08-07 (×2): 2 via ORAL
  Filled 2014-08-06 (×2): qty 2

## 2014-08-06 MED ORDER — LIDOCAINE HCL (PF) 1 % IJ SOLN
30.0000 mL | INTRAMUSCULAR | Status: DC | PRN
  Filled 2014-08-06: qty 30

## 2014-08-06 MED ORDER — EPHEDRINE 5 MG/ML INJ
10.0000 mg | INTRAVENOUS | Status: DC | PRN
  Filled 2014-08-06: qty 2

## 2014-08-06 MED ORDER — METHYLERGONOVINE MALEATE 0.2 MG/ML IJ SOLN: 0.2000 mg | INTRAMUSCULAR | Status: DC | PRN

## 2014-08-06 MED ORDER — ZOLPIDEM TARTRATE 5 MG PO TABS
5.0000 mg | ORAL_TABLET | Freq: Every evening | ORAL | Status: DC | PRN
Start: 2014-08-06 — End: 2014-08-08

## 2014-08-06 MED ORDER — DIPHENHYDRAMINE HCL 50 MG/ML IJ SOLN: 12.5000 mg | INTRAMUSCULAR | Status: DC | PRN

## 2014-08-06 MED ORDER — FENTANYL 2.5 MCG/ML BUPIVACAINE 1/10 % EPIDURAL INFUSION (WH - ANES)
14.0000 mL/h | INTRAMUSCULAR | Status: DC | PRN
Start: 2014-08-06 — End: 2014-08-06

## 2014-08-06 MED ORDER — DIPHENHYDRAMINE HCL 25 MG PO CAPS: 25.0000 mg | ORAL_CAPSULE | Freq: Four times a day (QID) | ORAL | Status: DC | PRN

## 2014-08-06 MED ORDER — TETANUS-DIPHTH-ACELL PERTUSSIS 5-2.5-18.5 LF-MCG/0.5 IM SUSP: 0.5000 mL | Freq: Once | INTRAMUSCULAR | Status: DC

## 2014-08-06 MED ORDER — FLEET ENEMA 7-19 GM/118ML RE ENEM: 1.0000 | ENEMA | RECTAL | Status: DC | PRN

## 2014-08-06 MED ORDER — PRENATAL MULTIVITAMIN CH
1.0000 | ORAL_TABLET | Freq: Every day | ORAL | Status: DC
  Administered 2014-08-07: 1 via ORAL
  Filled 2014-08-06: qty 1

## 2014-08-06 MED ORDER — ONDANSETRON HCL 4 MG PO TABS: 4.0000 mg | ORAL_TABLET | ORAL | Status: DC | PRN

## 2014-08-06 MED ORDER — METHYLERGONOVINE MALEATE 0.2 MG PO TABS
0.2000 mg | ORAL_TABLET | ORAL | Status: DC | PRN
Start: 2014-08-06 — End: 2014-08-08

## 2014-08-06 MED ORDER — FENTANYL 2.5 MCG/ML BUPIVACAINE 1/10 % EPIDURAL INFUSION (WH - ANES)
INTRAMUSCULAR | Status: AC
  Administered 2014-08-06: 14 mL/h via EPIDURAL
  Filled 2014-08-06: qty 125

## 2014-08-06 MED ORDER — LACTATED RINGERS IV SOLN
500.0000 mL | INTRAVENOUS | Status: DC | PRN
Start: 2014-08-06 — End: 2014-08-06
  Administered 2014-08-06: 500 mL via INTRAVENOUS

## 2014-08-06 MED ORDER — OXYTOCIN 40 UNITS IN LACTATED RINGERS INFUSION - SIMPLE MED
1.0000 m[IU]/min | INTRAVENOUS | Status: DC
  Administered 2014-08-06: 2 m[IU]/min via INTRAVENOUS
  Filled 2014-08-06: qty 1000

## 2014-08-06 MED ORDER — CITRIC ACID-SODIUM CITRATE 334-500 MG/5ML PO SOLN: 30.0000 mL | ORAL | Status: DC | PRN

## 2014-08-06 MED ORDER — OXYCODONE-ACETAMINOPHEN 5-325 MG PO TABS: 1.0000 | ORAL_TABLET | ORAL | Status: DC | PRN

## 2014-08-06 MED ORDER — LACTATED RINGERS IV SOLN
500.0000 mL | Freq: Once | INTRAVENOUS | Status: AC
  Administered 2014-08-06: 500 mL via INTRAVENOUS

## 2014-08-06 MED ORDER — OXYCODONE-ACETAMINOPHEN 5-325 MG PO TABS: 2.0000 | ORAL_TABLET | ORAL | Status: DC | PRN

## 2014-08-06 MED ORDER — FERROUS SULFATE 325 (65 FE) MG PO TABS
325.0000 mg | ORAL_TABLET | Freq: Two times a day (BID) | ORAL | Status: DC
  Administered 2014-08-07 – 2014-08-08 (×3): 325 mg via ORAL
  Filled 2014-08-06 (×3): qty 1

## 2014-08-06 MED ORDER — LIDOCAINE HCL (PF) 1 % IJ SOLN
INTRAMUSCULAR | Status: DC | PRN
  Administered 2014-08-06 (×2): 5 mL

## 2014-08-06 MED ORDER — PHENYLEPHRINE 40 MCG/ML (10ML) SYRINGE FOR IV PUSH (FOR BLOOD PRESSURE SUPPORT)
PREFILLED_SYRINGE | INTRAVENOUS | Status: AC
  Filled 2014-08-06: qty 20

## 2014-08-06 MED ORDER — BISACODYL 10 MG RE SUPP
10.0000 mg | Freq: Every day | RECTAL | Status: DC | PRN
Start: 2014-08-06 — End: 2014-08-08

## 2014-08-06 MED ORDER — DIBUCAINE 1 % RE OINT: 1.0000 "application " | TOPICAL_OINTMENT | RECTAL | Status: DC | PRN

## 2014-08-06 MED ORDER — PHENYLEPHRINE 40 MCG/ML (10ML) SYRINGE FOR IV PUSH (FOR BLOOD PRESSURE SUPPORT)
80.0000 ug | PREFILLED_SYRINGE | INTRAVENOUS | Status: DC | PRN
  Administered 2014-08-06 (×2): 80 ug via INTRAVENOUS
  Filled 2014-08-06: qty 2

## 2014-08-06 MED ORDER — TERBUTALINE SULFATE 1 MG/ML IJ SOLN
0.2500 mg | Freq: Once | INTRAMUSCULAR | Status: DC | PRN
  Filled 2014-08-06: qty 1

## 2014-08-06 MED ORDER — PHENYLEPHRINE 40 MCG/ML (10ML) SYRINGE FOR IV PUSH (FOR BLOOD PRESSURE SUPPORT)
80.0000 ug | PREFILLED_SYRINGE | INTRAVENOUS | Status: DC | PRN
  Filled 2014-08-06: qty 2

## 2014-08-06 MED ORDER — OXYCODONE-ACETAMINOPHEN 5-325 MG PO TABS
2.0000 | ORAL_TABLET | ORAL | Status: DC | PRN
  Administered 2014-08-07: 2 via ORAL
  Filled 2014-08-06: qty 2

## 2014-08-06 MED ORDER — ONDANSETRON HCL 4 MG/2ML IJ SOLN: 4.0000 mg | INTRAMUSCULAR | Status: DC | PRN

## 2014-08-06 MED ORDER — MEASLES, MUMPS & RUBELLA VAC ~~LOC~~ INJ
0.5000 mL | INJECTION | Freq: Once | SUBCUTANEOUS | Status: DC
Start: 2014-08-07 — End: 2014-08-08

## 2014-08-06 MED ORDER — WITCH HAZEL-GLYCERIN EX PADS: 1.0000 "application " | MEDICATED_PAD | CUTANEOUS | Status: DC | PRN

## 2014-08-06 NOTE — Anesthesia Preprocedure Evaluation (Signed)

## 2014-08-06 NOTE — Anesthesia Procedure Notes (Signed)
Epidural Patient location during procedure: OB Start time: 08/06/2014 5:11 PM  Staffing Anesthesiologist: Brayton CavesJACKSON, Safina Huard Performed by: anesthesiologist   Preanesthetic Checklist Completed: patient identified, site marked, surgical consent, pre-op evaluation, timeout performed, IV checked, risks and benefits discussed and monitors and equipment checked  Epidural Patient position: sitting Prep: site prepped and draped and DuraPrep Patient monitoring: continuous pulse ox and blood pressure Approach: midline Location: L3-L4 Injection technique: LOR air  Needle:  Needle type: Tuohy  Needle gauge: 17 G Needle length: 9 cm and 9 Needle insertion depth: 6 cm Catheter type: closed end flexible Catheter size: 19 Gauge Catheter at skin depth: 10 cm Test dose: negative  Assessment Events: blood not aspirated, injection not painful, no injection resistance, negative IV test and no paresthesia  Additional Notes Patient identified.  Risk benefits discussed including failed block, incomplete pain control, headache, nerve damage, paralysis, blood pressure changes, nausea, vomiting, reactions to medication both toxic or allergic, and postpartum back pain.  Patient expressed understanding and wished to proceed.  All questions were answered.  Sterile technique used throughout procedure and epidural site dressed with sterile barrier dressing. No paresthesia or other complications noted.The patient did not experience any signs of intravascular injection such as tinnitus or metallic taste in mouth nor signs of intrathecal spread such as rapid motor block. Please see nursing notes for vital signs.

## 2014-08-06 NOTE — Progress Notes (Signed)
S: Notes contractions every 2 minutes. No further complaints.  O: Fetal Monitor: baseline 140, moderate variability, reactive accelerations, no decelerations, contractions every 2 minutes noted Dilation: 3 Effacement (%): 60, 70 Station: -3, -2 Presentation: Vertex Exam by:: Ace GinsL. Cresenzo, RN   A/P:  Continue Pitocin and titrate up as indicated Will continue to monitor  Dr. Garry Heateraleigh Rumley [facility] PGY-1

## 2014-08-06 NOTE — H&P (Signed)
LABOR ADMISSION HISTORY AND PHYSICAL  Courtney Reese is a 24 y.o. female [phone] with IUP at [redacted]w[redacted]d by 14week [place] presenting for induction of labor for post-dates. She reports +FMs, No LOF, no VB, no blurry vision, headaches or peripheral edema, and RUQ pain.  She plans on bottle feeding. She has not decided on form of birth control.  Dating: By Sumner Boast [place] --->  Estimated Date of Delivery: 07/26/14  Prenatal History/Complications:  Past Medical History: Past Medical History  Diagnosis Date  . Hemorrhoids in pregnancy   . Hx: UTI (urinary tract infection)   . Anemia   . 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 Late prenatal care   . Vaginal Pap smear, abnormal     Past Surgical History: Past Surgical History  Procedure Laterality Date  . No past surgeries      Obstetrical History: OB History    Gravida Para Term Preterm AB TAB SAB Ectopic Multiple Living   Social History: History   Social History  . Marital Status: Single    Spouse Name: N/A  . Number of Children: N/A  . Years of Education: N/A   Social History Main Topics  . Smoking status: Former Smoker    Quit date: 10/22/2012  . Smokeless tobacco: Never Used  . Alcohol Use: No  . Drug Use: No  . Sexual Activity: Not on file   Other Topics Concern  . Not on file   Social History Narrative    Family History: Family History  Problem Relation Age of Onset  . Hypertension Maternal Grandfather   . Diabetes Maternal Grandfather     Allergies: No Known Allergies  Prescriptions prior to admission  Medication Sig Dispense Refill Last Dose  . Prenatal Vit-Fe Fumarate-FA (PRENATAL MULTIVITAMIN) TABS tablet Take 1 tablet by mouth daily at 12 noon.   08/06/2014 at Unknown time  . ibuprofen (ADVIL,MOTRIN) 600 MG tablet Take 1 tablet (600 mg total) by mouth every 6 (six) hours. (Patient not taking: Reported on 08/06/2014) 30 tablet 0   . oxyCODONE-acetaminophen (PERCOCET/ROXICET) 5-325 MG  per tablet Take 1-2 tablets by mouth every 4 (four) hours as needed. (Patient not taking: Reported on 08/06/2014) 30 tablet 0      Review of Systems   All systems reviewed and negative except as stated in HPI  Blood pressure 114/78, pulse 104, temperature 98.2 F (36.8 C), temperature source Oral, resp. rate 20, height  (1.575 m), weight 70.761 kg (156 lb), last menstrual period 10/19/2013, unknown if currently breastfeeding. General appearance: alert, cooperative and no distress Lungs: clear to auscultation bilaterally Heart: regular rate and rhythm Abdomen: soft, non-tender; bowel sounds normal Pelvic: Dilation: 3 Effacement (%): 50 Station: -2 Presentation: Vertex Exam by:: Ivonne Andrew CNM Extremities: Homans sign is negative, no sign of DVT Presentation: cephalic Fetal monitoringBaseline: 130 bpm, Variability: Good {> 6 bpm), Accelerations: Reactive and Decelerations: Absent Uterine activity: Contractions Noted   Prenatal labs: ABO, Rh: --/--/B POS (02/25 4540) Antibody: PENDING (02/25 0819) RPR: Nonreactive (10/05 0000)  HBsAg: Negative (10/05 0000)  HIV: Non-reactive (10/05 0000)  GBS: Negative (01/20 0000)  Rubella: Immune 1 hr Glucola: Negative Genetic screening- negative Anatomy US - negative   Results for orders placed or performed during the hospital encounter of 08/06/14 (from the past 24 hour(s))  CBC   Collection Time: 08/06/14  8:19 AM  Result Value  Ref Range   WBC 9.2 4.0 - 10.5 K/uL   RBC 4.06 3.87 - 5.11 MIL/uL   Hemoglobin 10.1 (L) 12.0 - 15.0 g/dL   HCT 62.132.0 (L) 30.836.0 - 65.746.0 %   MCV 78.8 78.0 - 100.0 fL   MCH 24.9 (L) 26.0 - 34.0 pg   MCHC 31.6 30.0 - 36.0 g/dL   RDW 84.615.7 (H) 96.211.5 - 95.215.5 %   Platelets 188 150 - 400 K/uL  Type and screen   Collection Time: 08/06/14  8:19 AM  Result Value Ref Range   ABO/RH(D) B POS    Antibody Screen PENDING    Sample Expiration 08/09/2014     Patient Active Problem List   Diagnosis Date Noted  .  Post-term pregnancy, 40-42 weeks of gestation   . [redacted] weeks gestation of pregnancy     Assessment: Courtney Reese is a 24 y.o. [phone] at 7940w4d here for induction of labor for post-dates at 41weeks 4 days. - Initiate induction. Pitocin started and will titrate up as indicated - Will continue to monitor - Clear liquid diet - Epidural for pain - GBS negative - Plans to bottle feed - Post-partum contraception undecided. Will continue to discuss throughout hospitalization  Araceli BoucheRumley, [place] N 08/06/2014, 9:49 AM  I was present for the exam and agree with above.  [place] Patrice Matthew, CNM 08/06/2014 10:07 AM

## 2014-08-07 LAB — RAPID URINE DRUG SCREEN, HOSP PERFORMED
AMPHETAMINES: NOT DETECTED
Barbiturates: NOT DETECTED
Benzodiazepines: NOT DETECTED
COCAINE: NOT DETECTED
Opiates: NOT DETECTED
TETRAHYDROCANNABINOL: NOT DETECTED

## 2014-08-07 NOTE — Progress Notes (Signed)
UR chart review completed.  

## 2014-08-07 NOTE — Progress Notes (Signed)
Post Partum Day 1 Subjective:  Courtney RippleKeyria M Reese is a 24 y.o. [id number] 4065w4d s/p SVD.  No acute events overnight.  Pt denies problems with ambulating, voiding or po intake.  She denies nausea or vomiting.  Pain is moderately controlled.   Lochia Moderate.  Plan for birth control is nexplanon.  Method of Feeding: bottle  Objective: Blood pressure 103/62, pulse 82, temperature 98.2 F (36.8 C), temperature source Oral, resp. rate 18, height 5\' 2"  (1.575 m), weight 156 lb (70.761 kg), last menstrual period 10/19/2013, SpO2 100 %, unknown if currently breastfeeding.  Physical Exam:  General: alert, cooperative and no distress Lochia:normal flow Chest: CTAB Heart: RRR no m/r/g Abdomen: +BS, soft, nontender,  Uterine Fundus: firm DVT Evaluation: No evidence of DVT seen on physical exam. Extremities: no edema   Recent Labs  08/06/14 0819  HGB 10.1*  HCT 32.0*    Assessment/Plan:  ASSESSMENT: Courtney Reese is a 24 y.o. [id number] 2565w4d s/p SVD  Plan for discharge tomorrow   LOS: 1 day   Taia Bramlett ROCIO 08/07/2014, 9:02 AM

## 2014-08-07 NOTE — Progress Notes (Signed)
Clinical Social Work Department PSYCHOSOCIAL ASSESSMENT - MATERNAL/CHILD 08/07/2014  Patient:  Courtney Reese, Courtney Reese  Account Number:  [id number]  [facility] Date:  08/06/2014  Ardine Eng Name:   Courtney Reese   Clinical Social Worker:  Lucita Ferrara, CLINICAL SOCIAL WORKER   Date/Time:  08/07/2014 02:00 PM  Date Referred:  08/06/2014   Referral source  Central Nursery     Referred reason  Substance Abuse   Other referral source:    I:  FAMILY / HOME ENVIRONMENT Child's legal guardian:  PARENT  Guardian - Name Guardian - Age Guardian - Address  Courtney Reese [address] [place], [place] [address]  Courtney Reese  currently incarcerated   Other household support members/support persons Name Relationship DOB   DAUGHTER 01/13/08   SON 01/02/10   SON 03/07/13   Other support:   MOB endorsed strong family support.    II  PSYCHOSOCIAL DATA Information Source:  Patient Interview  Occupational hygienist Employment:   Currently unemployed   Financial resources:  Medicaid If [place] Other  [place] / Grade:  N/A Music therapist / Industrial/product designer / Early Interventions:   None reported  Cultural issues impacting care:   None reported    III  STRENGTHS Strengths  Adequate Resources  Home prepared for Child (including basic supplies)  Supportive family/friends   Strength comment:    IV  RISK FACTORS AND CURRENT PROBLEMS Current Problem:  YES   Risk Factor & Current Problem Patient Issue Family Issue Risk Factor / Current Problem Comment  Substance Abuse Y N MOB presents with THC use 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  MOB reported last use 4-5 months ago. Infant's UDS is negative and MDS is pending.    V  SOCIAL WORK ASSESSMENT CSW met with the MOB due to history of THC use early in pregnancy (MOB with +UDS in October 2015).  MOB presented as easily engaged and receptive to the visit once her friend and family member  left the room.  She displayed a full range in affect and presented in a pleasant mood. She smiled frequently when she reflected upon her role as a mother, and it was evident that motherhood and her children are important to the MOB.  MOB expressed appreciation for the visit, appeared genuinely engaged in the visit, and asked appropriate questions.   MOB presented as excited and eager to transition to 4 children since she now has "two of each".  MOB originally minimized feelings of stress as she adjusts to her new "normal", but eventually began to process the stress she feels since the FOB is currently incarcerated.  She reflected upon her feelings that she is a "single parent", since she found out that she was pregnant the same day the FOB was incarcerated for probation violation.   The MOB discussed that she has maintained contact with the FOB during the pregnancy, but is saddened that he has been absent during the pregnancy and birth since he was also absent during the birth of their last child together due to a different incarceration.  She shared that she hopes that he will "get his act together", but also acknowledged that she has limited tolerance for his behaviors. She shared that she intends to only give him "one more chance" once he is released from prison next month. The MOB acknowledged that she has a lot of stress due to being a single parent without employment.  She endorsed  gratitude for her strong family support that ensures that all needs are met.    MOB reported belief that she is able to "keep it together", but recognized that it is not always "easy". She stated that she engages in daily self-care and utilizes her support (her children spend time with her family) in order to take a "break".  MOB shared that she hopes to obtain her GED someday since she regrets dropping out of school after her first child was born.  The MOB smiled as she reflected upon the type of role model she wants to be for  her children, and she shared a belief that she is being a positive role model at this time since she is able to care for herself and her children. MOB denied acute mental health concerns and history of PPD, but she asked appropriate questions related to signs and symptoms about PPD.   MOB acknowledged THC use 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 She denied any use in past 4-5 months since she did not want the infant to test positive and have CPS involvement.  MOB verbalized awareness and understanding of the hospital drug screen policy, and denied additional questions or concerns.   No barriers to discharge.   VI SOCIAL WORK PLAN Social Work Therapist, art  No Further Intervention Required / No Barriers to Discharge   Type of pt/family education:   Postpartum depression  Hospital drug screen policy   If child protective services report - county:  N/A If child protective services report - date:  N/A Information/referral to community resources comment:   No needs identified.   Other social work plan:   CSW to follow up PRN or upon MOB request.  CSW will monitor MDS and will make a CPS report if positive.

## 2014-08-07 NOTE — Progress Notes (Signed)
CSW attempted to meet with MOB due to THC use during pregnancy.   MOB was in the middle of going down to the cafeteria, and requested that CSW return at a later time. CSW will make second attempt this afternoon. 

## 2014-08-07 NOTE — Anesthesia Postprocedure Evaluation (Signed)
  Anesthesia Post-op Note  Patient: Courtney Reese  Procedure(s) Performed: * No procedures listed *  Patient Location: Mother/Baby  Anesthesia Type:Epidural  Level of Consciousness: awake, alert , oriented and patient cooperative  Airway and Oxygen Therapy: Patient Spontanous Breathing  Post-op Pain: mild  Post-op Assessment: Post-op Vital signs reviewed, Patient's Cardiovascular Status Stable, Respiratory Function Stable, Patent Airway, No signs of Nausea or vomiting, Adequate PO intake, Pain level controlled, No headache, No backache, No residual numbness and No residual motor weakness  Post-op Vital Signs: Reviewed and stable  Last Vitals:  Filed Vitals:   08/07/14 0615  BP: 103/62  Pulse: 82  Temp: 36.8 C  Resp: 18    Complications: No apparent anesthesia complications

## 2014-08-08 MED ORDER — IBUPROFEN 600 MG PO TABS
600.0000 mg | ORAL_TABLET | Freq: Four times a day (QID) | ORAL | Status: DC
Start: 2014-08-08 — End: 2016-07-10

## 2014-08-08 NOTE — Discharge Summary (Signed)
Obstetric Discharge Summary Reason for Admission: induction of labor Prenatal Procedures: NST Intrapartum Procedures: spontaneous vaginal delivery Postpartum Procedures: none Complications-Operative and Postpartum: none HEMOGLOBIN  Date Value Ref Range Status  08/06/2014 10.1* 12.0 - 15.0 g/dL Final   HCT  Date Value Ref Range Status  08/06/2014 32.0* 36.0 - 46.0 % Final    Physical Exam:  General: alert, cooperative and no distress Lochia: appropriate Uterine Fundus: firm Incision: n/a DVT Evaluation: No evidence of DVT seen on physical exam. No cords or calf tenderness. No significant calf/ankle edema.  Discharge Diagnoses: Term Pregnancy-delivered  Discharge Information: Date: 08/08/2014 Activity: pelvic rest Diet: routine Medications: PNV and Ibuprofen Condition: stable Instructions: refer to practice specific booklet Discharge to: home Follow-up Information    [facility] [place] In 6 weeks.   Contact information:   [address] [place] [id number] [phone]      Newborn Data: Live born female  Birth Weight: 7 lb 8.3 oz (3410 g) APGAR: 9, 9  Home with mother.  Courtney Reese, Courtney Reese 08/08/2014, 6:25 AM

## 2014-08-08 NOTE — Discharge Instructions (Signed)

## 2016-02-15 LAB — OB RESULTS CONSOLE GC/CHLAMYDIA
Chlamydia: NEGATIVE
Gonorrhea: NEGATIVE

## 2016-02-15 LAB — OB RESULTS CONSOLE RPR: RPR: NONREACTIVE

## 2016-02-15 LAB — OB RESULTS CONSOLE HIV ANTIBODY (ROUTINE TESTING): HIV: NONREACTIVE

## 2016-02-15 LAB — OB RESULTS CONSOLE HEPATITIS B SURFACE ANTIGEN: HEP B S AG: NEGATIVE

## 2016-02-15 LAB — OB RESULTS CONSOLE RUBELLA ANTIBODY, IGM: Rubella: IMMUNE

## 2016-05-30 ENCOUNTER — Encounter: Payer: Self-pay | Admitting: *Deleted

## 2016-05-30 LAB — OB RESULTS CONSOLE GC/CHLAMYDIA
CHLAMYDIA, DNA PROBE: NEGATIVE
Gonorrhea: NEGATIVE

## 2016-05-30 LAB — OB RESULTS CONSOLE GBS: GBS: NEGATIVE

## 2016-06-12 NOTE — L&D Delivery Note (Signed)
26 y.o. [id number] at 847w0d delivered a viable female infant in cephalic, ROA position. Anterior shoulder delivered with ease. 60 sec delayed cord clamping. Cord clamped x2 and cut. Placenta delivered spontaneously intact, with 3VC. Fundus firm on exam with massage and pitocin. Good hemostasis noted.  Laceration: superficial periurethral Suture: none Good hemostasis noted. EBL: 100cc  Mom and baby recovering in LDR.    Apgars: 9/9 Weight: pending    Renne Muscaaniel L Warden, MD PGY-1 07/10/2016, 11:41 AM   I was gloved and present for entire delivery SVD without incident No difficulty with shoulders No lacerations Lacerations as listed above  Aviva SignsMarie L Elester Apodaca, CNM

## 2016-07-01 ENCOUNTER — Encounter (HOSPITAL_COMMUNITY): Payer: Self-pay | Admitting: *Deleted

## 2016-07-01 ENCOUNTER — Inpatient Hospital Stay (HOSPITAL_COMMUNITY)
Admission: AD | Admit: 2016-07-01 | Discharge: 2016-07-02 | Disposition: A | Discharge status: Home / Self Care | Payer: Medicaid Other | Source: Ambulatory Visit | Attending: Obstetrics and Gynecology | Admitting: Obstetrics and Gynecology

## 2016-07-01 DIAGNOSIS — O479 False labor, unspecified: Secondary | ICD-10-CM | POA: Insufficient documentation

## 2016-07-01 DIAGNOSIS — Z3490 Encounter for supervision of normal pregnancy, unspecified, unspecified trimester: Secondary | ICD-10-CM | POA: Insufficient documentation

## 2016-07-01 DIAGNOSIS — Z3A00 Weeks of gestation of pregnancy not specified: Secondary | ICD-10-CM | POA: Insufficient documentation

## 2016-07-01 NOTE — MAU Note (Signed)
Initial tracing from 2248 to 2305 are for another pt and not Barbaraann BarthelKeyria Tatem.

## 2016-07-01 NOTE — MAU Note (Signed)
Contractions for couple days. Stronger tonight. Denies LOF or bleeding. Was walking earlier and got dizzy

## 2016-07-02 DIAGNOSIS — Z3490 Encounter for supervision of normal pregnancy, unspecified, unspecified trimester: Secondary | ICD-10-CM | POA: Diagnosis not present

## 2016-07-02 DIAGNOSIS — O479 False labor, unspecified: Secondary | ICD-10-CM | POA: Diagnosis not present

## 2016-07-02 DIAGNOSIS — Z3A00 Weeks of gestation of pregnancy not specified: Secondary | ICD-10-CM | POA: Diagnosis not present

## 2016-07-02 NOTE — Discharge Instructions (Signed)
Braxton Hicks Contractions °Contractions of the uterus can occur throughout pregnancy. Contractions are not always a sign that you are in labor.  °WHAT ARE BRAXTON HICKS CONTRACTIONS?  °Contractions that occur before labor are called Braxton Hicks contractions, or false labor. Toward the end of pregnancy (32-34 weeks), these contractions can develop more often and may become more forceful. This is not true labor because these contractions do not result in opening (dilatation) and thinning of the cervix. They are sometimes difficult to tell apart from true labor because these contractions can be forceful and people have different pain tolerances. You should not feel embarrassed if you go to the hospital with false labor. Sometimes, the only way to tell if you are in true labor is for your health care provider to look for changes in the cervix. °If there are no prenatal problems or other health problems associated with the pregnancy, it is completely safe to be sent home with false labor and await the onset of true labor. °HOW CAN YOU TELL THE DIFFERENCE BETWEEN TRUE AND FALSE LABOR? °False Labor  °· The contractions of false labor are usually shorter and not as hard as those of true labor.   °· The contractions are usually irregular.   °· The contractions are often felt in the front of the lower abdomen and in the groin.   °· The contractions may go away when you walk around or change positions while lying down.   °· The contractions get weaker and are shorter lasting as time goes on.   °· The contractions do not usually become progressively stronger, regular, and closer together as with true labor.   °True Labor  °· Contractions in true labor last 30-70 seconds, become very regular, usually become more intense, and increase in frequency.   °· The contractions do not go away with walking.   °· The discomfort is usually felt in the top of the uterus and spreads to the lower abdomen and low back.   °· True labor can be  determined by your health care provider with an exam. This will show that the cervix is dilating and getting thinner.   °WHAT TO REMEMBER °· Keep up with your usual exercises and follow other instructions given by your health care provider.   °· Take medicines as directed by your health care provider.   °· Keep your regular prenatal appointments.   °· Eat and drink lightly if you think you are going into labor.   °· If Braxton Hicks contractions are making you uncomfortable:   °¨ Change your position from lying down or resting to walking, or from walking to resting.   °¨ Sit and rest in a tub of warm water.   °¨ Drink 2-3 glasses of water. Dehydration may cause these contractions.   °¨ Do slow and deep breathing several times an hour.   °WHEN SHOULD I SEEK IMMEDIATE MEDICAL CARE? °Seek immediate medical care if: °· Your contractions become stronger, more regular, and closer together.   °· You have fluid leaking or gushing from your vagina.   °· You have a fever.   °· You pass blood-tinged mucus.   °· You have vaginal bleeding.   °· You have continuous abdominal pain.   °· You have low back pain that you never had before.   °· You feel your baby's head pushing down and causing pelvic pressure.   °· Your baby is not moving as much as it used to.   °This information is not intended to replace advice given to you by your health care provider. Make sure you discuss any questions you have with your health care   provider. °Document Released: 05/29/2005 Document Revised: 09/20/2015 Document Reviewed: 03/10/2013 °Elsevier Interactive Patient Education © 2017 [employer] ° °

## 2016-07-03 ENCOUNTER — Telehealth (HOSPITAL_COMMUNITY): Payer: Self-pay | Admitting: *Deleted

## 2016-07-03 NOTE — Telephone Encounter (Signed)
Preadmission screen  

## 2016-07-10 ENCOUNTER — Inpatient Hospital Stay (HOSPITAL_COMMUNITY): Payer: Medicaid Other | Admitting: Anesthesiology

## 2016-07-10 ENCOUNTER — Encounter (HOSPITAL_COMMUNITY): Payer: Self-pay

## 2016-07-10 ENCOUNTER — Encounter (HOSPITAL_COMMUNITY)
Admission: RE | Disposition: A | Discharge status: Home / Self Care | Payer: Self-pay | Source: Ambulatory Visit | Attending: Obstetrics & Gynecology

## 2016-07-10 ENCOUNTER — Inpatient Hospital Stay (HOSPITAL_COMMUNITY)
Admission: RE | Admit: 2016-07-10 | Discharge: 2016-07-12 | DRG: 767 | Disposition: A | Discharge status: Home / Self Care | Payer: Medicaid Other | Source: Ambulatory Visit | Attending: Obstetrics & Gynecology | Admitting: Obstetrics & Gynecology

## 2016-07-10 DIAGNOSIS — Z3490 Encounter for supervision of normal pregnancy, unspecified, unspecified trimester: Secondary | ICD-10-CM

## 2016-07-10 DIAGNOSIS — O480 Post-term pregnancy: Principal | ICD-10-CM | POA: Diagnosis present

## 2016-07-10 DIAGNOSIS — Z3A41 41 weeks gestation of pregnancy: Secondary | ICD-10-CM

## 2016-07-10 DIAGNOSIS — Z302 Encounter for sterilization: Secondary | ICD-10-CM

## 2016-07-10 DIAGNOSIS — Z8249 Family history of ischemic heart disease and other diseases of the circulatory system: Secondary | ICD-10-CM

## 2016-07-10 DIAGNOSIS — Z87891 Personal history of nicotine dependence: Secondary | ICD-10-CM | POA: Diagnosis not present

## 2016-07-10 DIAGNOSIS — Z833 Family history of diabetes mellitus: Secondary | ICD-10-CM | POA: Diagnosis not present

## 2016-07-10 DIAGNOSIS — O418X30 Other specified disorders of amniotic fluid and membranes, third trimester, not applicable or unspecified: Secondary | ICD-10-CM

## 2016-07-10 DIAGNOSIS — O99323 Drug use complicating pregnancy, third trimester: Secondary | ICD-10-CM

## 2016-07-10 DIAGNOSIS — Z9851 Tubal ligation status: Secondary | ICD-10-CM

## 2016-07-10 HISTORY — PX: TUBAL LIGATION: SHX77

## 2016-07-10 LAB — CBC
HCT: 33.5 % — ABNORMAL LOW (ref 36.0–46.0)
Hemoglobin: 10.8 g/dL — ABNORMAL LOW (ref 12.0–15.0)
MCH: 24.7 pg — AB (ref 26.0–34.0)
MCHC: 32.2 g/dL (ref 30.0–36.0)
MCV: 76.7 fL — ABNORMAL LOW (ref 78.0–100.0)
PLATELETS: 225 10*3/uL (ref 150–400)
RBC: 4.37 MIL/uL (ref 3.87–5.11)
RDW: 15.9 % — ABNORMAL HIGH (ref 11.5–15.5)
WBC: 8.7 10*3/uL (ref 4.0–10.5)

## 2016-07-10 LAB — TYPE AND SCREEN
ABO/RH(D): B POS
Antibody Screen: NEGATIVE

## 2016-07-10 LAB — RPR: RPR Ser Ql: NONREACTIVE

## 2016-07-10 SURGERY — LIGATION, FALLOPIAN TUBE, POSTPARTUM
Anesthesia: Epidural | Laterality: Bilateral | Wound class: Clean Contaminated

## 2016-07-10 MED ORDER — ONDANSETRON HCL 4 MG/2ML IJ SOLN
INTRAMUSCULAR | Status: AC
  Filled 2016-07-10: qty 2

## 2016-07-10 MED ORDER — PHENYLEPHRINE 40 MCG/ML (10ML) SYRINGE FOR IV PUSH (FOR BLOOD PRESSURE SUPPORT)
80.0000 ug | PREFILLED_SYRINGE | INTRAVENOUS | Status: DC | PRN
  Filled 2016-07-10: qty 10

## 2016-07-10 MED ORDER — FENTANYL CITRATE (PF) 100 MCG/2ML IJ SOLN
INTRAMUSCULAR | Status: AC
  Filled 2016-07-10: qty 4

## 2016-07-10 MED ORDER — OXYCODONE-ACETAMINOPHEN 5-325 MG PO TABS: 2.0000 | ORAL_TABLET | ORAL | Status: DC | PRN

## 2016-07-10 MED ORDER — ZOLPIDEM TARTRATE 5 MG PO TABS: 5.0000 mg | ORAL_TABLET | Freq: Every evening | ORAL | Status: DC | PRN

## 2016-07-10 MED ORDER — FENTANYL 2.5 MCG/ML BUPIVACAINE 1/10 % EPIDURAL INFUSION (WH - ANES): 14.0000 mL/h | INTRAMUSCULAR | Status: DC | PRN

## 2016-07-10 MED ORDER — FENTANYL CITRATE (PF) 100 MCG/2ML IJ SOLN
100.0000 ug | INTRAMUSCULAR | Status: DC | PRN
  Administered 2016-07-10: 100 ug via INTRAVENOUS
  Filled 2016-07-10: qty 2

## 2016-07-10 MED ORDER — IBUPROFEN 600 MG PO TABS
600.0000 mg | ORAL_TABLET | Freq: Four times a day (QID) | ORAL | Status: DC
  Administered 2016-07-10 – 2016-07-12 (×7): 600 mg via ORAL
  Filled 2016-07-10 (×7): qty 1

## 2016-07-10 MED ORDER — MIDAZOLAM HCL 2 MG/2ML IJ SOLN
INTRAMUSCULAR | Status: AC
  Filled 2016-07-10: qty 2

## 2016-07-10 MED ORDER — HYDROMORPHONE HCL 1 MG/ML IJ SOLN: 0.2500 mg | INTRAMUSCULAR | Status: DC | PRN

## 2016-07-10 MED ORDER — PNEUMOCOCCAL VAC POLYVALENT 25 MCG/0.5ML IJ INJ
0.5000 mL | INJECTION | INTRAMUSCULAR | Status: DC
  Filled 2016-07-10: qty 0.5

## 2016-07-10 MED ORDER — LIDOCAINE HCL (PF) 1 % IJ SOLN
30.0000 mL | INTRAMUSCULAR | Status: DC | PRN
  Filled 2016-07-10: qty 30

## 2016-07-10 MED ORDER — PRENATAL MULTIVITAMIN CH
1.0000 | ORAL_TABLET | Freq: Every day | ORAL | Status: DC
  Administered 2016-07-11 – 2016-07-12 (×2): 1 via ORAL
  Filled 2016-07-10 (×2): qty 1

## 2016-07-10 MED ORDER — SIMETHICONE 80 MG PO CHEW: 80.0000 mg | CHEWABLE_TABLET | ORAL | Status: DC | PRN

## 2016-07-10 MED ORDER — ONDANSETRON HCL 4 MG/2ML IJ SOLN: 4.0000 mg | Freq: Four times a day (QID) | INTRAMUSCULAR | Status: DC | PRN

## 2016-07-10 MED ORDER — LIDOCAINE-EPINEPHRINE (PF) 2 %-1:200000 IJ SOLN
INTRAMUSCULAR | Status: AC
  Filled 2016-07-10: qty 20

## 2016-07-10 MED ORDER — MISOPROSTOL 25 MCG QUARTER TABLET
25.0000 ug | ORAL_TABLET | ORAL | Status: DC | PRN
  Administered 2016-07-10: 25 ug via VAGINAL
  Filled 2016-07-10: qty 0.25

## 2016-07-10 MED ORDER — TERBUTALINE SULFATE 1 MG/ML IJ SOLN: 0.2500 mg | Freq: Once | INTRAMUSCULAR | Status: DC | PRN

## 2016-07-10 MED ORDER — LACTATED RINGERS IV SOLN
INTRAVENOUS | Status: DC | PRN
  Administered 2016-07-10: 15:00:00 via INTRAVENOUS

## 2016-07-10 MED ORDER — PHENYLEPHRINE 40 MCG/ML (10ML) SYRINGE FOR IV PUSH (FOR BLOOD PRESSURE SUPPORT): 80.0000 ug | PREFILLED_SYRINGE | INTRAVENOUS | Status: DC | PRN

## 2016-07-10 MED ORDER — BUPIVACAINE HCL (PF) 0.25 % IJ SOLN
INTRAMUSCULAR | Status: AC
  Filled 2016-07-10: qty 30

## 2016-07-10 MED ORDER — OXYTOCIN 40 UNITS IN LACTATED RINGERS INFUSION - SIMPLE MED
2.5000 [IU]/h | INTRAVENOUS | Status: DC
  Administered 2016-07-10: 100 mL via INTRAVENOUS
  Administered 2016-07-10: 2.5 [IU]/h via INTRAVENOUS
  Filled 2016-07-10: qty 1000

## 2016-07-10 MED ORDER — BUPIVACAINE HCL (PF) 0.25 % IJ SOLN
INTRAMUSCULAR | Status: DC | PRN
  Administered 2016-07-10: 10 mL

## 2016-07-10 MED ORDER — BENZOCAINE-MENTHOL 20-0.5 % EX AERO
1.0000 "application " | INHALATION_SPRAY | CUTANEOUS | Status: DC | PRN
  Administered 2016-07-10: 1 via TOPICAL
  Filled 2016-07-10: qty 56

## 2016-07-10 MED ORDER — LIDOCAINE-EPINEPHRINE (PF) 2 %-1:200000 IJ SOLN
INTRAMUSCULAR | Status: DC | PRN
  Administered 2016-07-10: 7 mL via EPIDURAL
  Administered 2016-07-10: 5 mL via EPIDURAL
  Administered 2016-07-10: 3 mL via EPIDURAL
  Administered 2016-07-10: 5 mL via EPIDURAL
  Administered 2016-07-10: 3 mL via EPIDURAL

## 2016-07-10 MED ORDER — LACTATED RINGERS IV SOLN: 500.0000 mL | Freq: Once | INTRAVENOUS | Status: DC

## 2016-07-10 MED ORDER — ACETAMINOPHEN 325 MG PO TABS
650.0000 mg | ORAL_TABLET | ORAL | Status: DC | PRN
  Administered 2016-07-10 – 2016-07-11 (×2): 650 mg via ORAL
  Filled 2016-07-10 (×2): qty 2

## 2016-07-10 MED ORDER — TETANUS-DIPHTH-ACELL PERTUSSIS 5-2.5-18.5 LF-MCG/0.5 IM SUSP: 0.5000 mL | Freq: Once | INTRAMUSCULAR | Status: DC

## 2016-07-10 MED ORDER — WITCH HAZEL-GLYCERIN EX PADS
1.0000 | MEDICATED_PAD | CUTANEOUS | Status: DC | PRN
Start: 2016-07-10 — End: 2016-07-12

## 2016-07-10 MED ORDER — OXYCODONE-ACETAMINOPHEN 5-325 MG PO TABS: 1.0000 | ORAL_TABLET | ORAL | Status: DC | PRN

## 2016-07-10 MED ORDER — PROMETHAZINE HCL 25 MG/ML IJ SOLN: 6.2500 mg | INTRAMUSCULAR | Status: DC | PRN

## 2016-07-10 MED ORDER — FENTANYL CITRATE (PF) 100 MCG/2ML IJ SOLN
INTRAMUSCULAR | Status: AC
  Filled 2016-07-10: qty 2

## 2016-07-10 MED ORDER — FLEET ENEMA 7-19 GM/118ML RE ENEM: 1.0000 | ENEMA | RECTAL | Status: DC | PRN

## 2016-07-10 MED ORDER — EPHEDRINE 5 MG/ML INJ: 10.0000 mg | INTRAVENOUS | Status: DC | PRN

## 2016-07-10 MED ORDER — ONDANSETRON HCL 4 MG/2ML IJ SOLN
INTRAMUSCULAR | Status: DC | PRN
  Administered 2016-07-10: 4 mg via INTRAVENOUS

## 2016-07-10 MED ORDER — SODIUM BICARBONATE 8.4 % IV SOLN
INTRAVENOUS | Status: AC
  Filled 2016-07-10: qty 50

## 2016-07-10 MED ORDER — DIPHENHYDRAMINE HCL 50 MG/ML IJ SOLN: 12.5000 mg | INTRAMUSCULAR | Status: DC | PRN

## 2016-07-10 MED ORDER — SOD CITRATE-CITRIC ACID 500-334 MG/5ML PO SOLN
30.0000 mL | ORAL | Status: DC | PRN
  Administered 2016-07-10: 30 mL via ORAL
  Filled 2016-07-10: qty 15

## 2016-07-10 MED ORDER — ONDANSETRON HCL 4 MG/2ML IJ SOLN: 4.0000 mg | INTRAMUSCULAR | Status: DC | PRN

## 2016-07-10 MED ORDER — LACTATED RINGERS IV SOLN
500.0000 mL | INTRAVENOUS | Status: DC | PRN
  Administered 2016-07-10: 500 mL via INTRAVENOUS

## 2016-07-10 MED ORDER — OXYTOCIN BOLUS FROM INFUSION
500.0000 mL | Freq: Once | INTRAVENOUS | Status: AC
  Administered 2016-07-10: 500 mL via INTRAVENOUS

## 2016-07-10 MED ORDER — SENNOSIDES-DOCUSATE SODIUM 8.6-50 MG PO TABS
2.0000 | ORAL_TABLET | ORAL | Status: DC
  Administered 2016-07-10 – 2016-07-11 (×2): 2 via ORAL
  Filled 2016-07-10 (×2): qty 2

## 2016-07-10 MED ORDER — LACTATED RINGERS IV SOLN
INTRAVENOUS | Status: DC
  Administered 2016-07-10 (×2): via INTRAVENOUS

## 2016-07-10 MED ORDER — OXYCODONE HCL 5 MG PO TABS
5.0000 mg | ORAL_TABLET | ORAL | Status: DC | PRN
  Administered 2016-07-11 – 2016-07-12 (×5): 5 mg via ORAL
  Filled 2016-07-10 (×5): qty 1

## 2016-07-10 MED ORDER — MEPERIDINE HCL 25 MG/ML IJ SOLN
INTRAMUSCULAR | Status: AC
  Filled 2016-07-10: qty 1

## 2016-07-10 MED ORDER — FENTANYL 2.5 MCG/ML BUPIVACAINE 1/10 % EPIDURAL INFUSION (WH - ANES)
14.0000 mL/h | INTRAMUSCULAR | Status: DC | PRN
  Administered 2016-07-10: 14 mL/h via EPIDURAL
  Filled 2016-07-10: qty 100

## 2016-07-10 MED ORDER — FENTANYL CITRATE (PF) 100 MCG/2ML IJ SOLN
INTRAMUSCULAR | Status: DC | PRN
  Administered 2016-07-10: 50 ug via INTRAVENOUS
  Administered 2016-07-10: 100 ug via EPIDURAL
  Administered 2016-07-10: 50 ug via INTRAVENOUS

## 2016-07-10 MED ORDER — ACETAMINOPHEN 325 MG PO TABS: 650.0000 mg | ORAL_TABLET | ORAL | Status: DC | PRN

## 2016-07-10 MED ORDER — MIDAZOLAM HCL 5 MG/5ML IJ SOLN
INTRAMUSCULAR | Status: DC | PRN
  Administered 2016-07-10 (×3): 1 mg via INTRAVENOUS

## 2016-07-10 MED ORDER — DIBUCAINE 1 % RE OINT: 1.0000 "application " | TOPICAL_OINTMENT | RECTAL | Status: DC | PRN

## 2016-07-10 MED ORDER — DIPHENHYDRAMINE HCL 25 MG PO CAPS
25.0000 mg | ORAL_CAPSULE | Freq: Four times a day (QID) | ORAL | Status: DC | PRN
  Administered 2016-07-10: 25 mg via ORAL
  Filled 2016-07-10 (×2): qty 1

## 2016-07-10 MED ORDER — LIDOCAINE HCL (PF) 1 % IJ SOLN
INTRAMUSCULAR | Status: DC | PRN
  Administered 2016-07-10: 4 mL via EPIDURAL
  Administered 2016-07-10: 4 mL

## 2016-07-10 MED ORDER — COCONUT OIL OIL
1.0000 | TOPICAL_OIL | Status: DC | PRN
Start: 2016-07-10 — End: 2016-07-12

## 2016-07-10 MED ORDER — MEPERIDINE HCL 25 MG/ML IJ SOLN: 6.2500 mg | INTRAMUSCULAR | Status: DC | PRN

## 2016-07-10 MED ORDER — ONDANSETRON HCL 4 MG PO TABS: 4.0000 mg | ORAL_TABLET | ORAL | Status: DC | PRN

## 2016-07-10 MED ORDER — MEPERIDINE HCL 25 MG/ML IJ SOLN
INTRAMUSCULAR | Status: DC | PRN
  Administered 2016-07-10: 12.5 mg via INTRAVENOUS

## 2016-07-10 SURGICAL SUPPLY — 23 items
BLADE SURG 11 STRL SS (BLADE) ×3 IMPLANT
CLIP FILSHIE TUBAL LIGA STRL (Clip) ×6 IMPLANT
CLOTH BEACON ORANGE TIMEOUT ST (SAFETY) ×3 IMPLANT
DRSG OPSITE POSTOP 3X4 (GAUZE/BANDAGES/DRESSINGS) ×3 IMPLANT
DRSG OPSITE POSTOP 4X10 (GAUZE/BANDAGES/DRESSINGS) ×3 IMPLANT
DURAPREP 26ML APPLICATOR (WOUND CARE) ×3 IMPLANT
GLOVE BIO SURGEON STRL SZ 6.5 (GLOVE) ×2 IMPLANT
GLOVE BIO SURGEONS STRL SZ 6.5 (GLOVE) ×1
GLOVE BIOGEL PI IND STRL 7.0 (GLOVE) ×1 IMPLANT
GLOVE BIOGEL PI INDICATOR 7.0 (GLOVE) ×2
GOWN STRL REUS W/TWL LRG LVL3 (GOWN DISPOSABLE) ×3 IMPLANT
NEEDLE HYPO 22GX1.5 SAFETY (NEEDLE) ×3 IMPLANT
NS IRRIG 1000ML POUR BTL (IV SOLUTION) ×3 IMPLANT
PACK ABDOMINAL MINOR (CUSTOM PROCEDURE TRAY) ×3 IMPLANT
PROTECTOR NERVE ULNAR (MISCELLANEOUS) ×3 IMPLANT
SPONGE LAP 4X18 X RAY DECT (DISPOSABLE) ×3 IMPLANT
SUT VIC AB 0 CT1 27 (SUTURE) ×3
SUT VIC AB 0 CT1 27XBRD ANBCTR (SUTURE) ×1 IMPLANT
SUT VICRYL 4-0 PS2 18IN ABS (SUTURE) ×3 IMPLANT
SYR CONTROL 10ML LL (SYRINGE) ×3 IMPLANT
TOWEL OR 17X24 6PK STRL BLUE (TOWEL DISPOSABLE) ×6 IMPLANT
TRAY FOLEY CATH SILVER 16FR (SET/KITS/TRAYS/PACK) ×3 IMPLANT
WATER STERILE IRR 1000ML POUR (IV SOLUTION) ×3 IMPLANT

## 2016-07-10 NOTE — Anesthesia Preprocedure Evaluation (Addendum)
Anesthesia Evaluation  Patient identified by MRN, date of birth, ID band Patient awake    Reviewed: Allergy & Precautions, H&P , Patient's Chart, lab work & pertinent test results  Airway Mallampati: II  TM Distance: >3 FB Neck ROM: full    Dental   Pulmonary former smoker,    breath sounds clear to auscultation       Cardiovascular negative cardio ROS   Rhythm:regular Rate:Normal     Neuro/Psych negative neurological ROS  negative psych ROS   GI/Hepatic negative GI ROS, Neg liver ROS,   Endo/Other  negative endocrine ROS  Renal/GU negative Renal ROS     Musculoskeletal   Abdominal   Peds  Hematology  (+) anemia ,   Anesthesia Other Findings   Reproductive/Obstetrics (+) Pregnancy                            Anesthesia Physical  Anesthesia Plan  ASA: II  Anesthesia Plan: Epidural   Post-op Pain Management:    Induction:   Airway Management Planned:   Additional Equipment:   Intra-op Plan:   Post-operative Plan:   Informed Consent: I have reviewed the patients History and Physical, chart, labs and discussed the procedure including the risks, benefits and alternatives for the proposed anesthesia with the patient or authorized representative who has indicated his/her understanding and acceptance.     Plan Discussed with:   Anesthesia Plan Comments:         Anesthesia Quick Evaluation

## 2016-07-10 NOTE — Transfer of Care (Signed)
Immediate Anesthesia Transfer of Care Note  Patient: Courtney Reese  Procedure(s) Performed: Procedure(s): POST PARTUM TUBAL LIGATION (Bilateral)  Patient Location: PACU  Anesthesia Type:Epidural  Level of Consciousness: awake, alert  and oriented  Airway & Oxygen Therapy: Patient Spontanous Breathing  Post-op Assessment: Report given to RN and Post -op Vital signs reviewed and stable  Post vital signs: Reviewed and stable  Last Vitals:  Vitals:   07/10/16 1400 07/10/16 1430  BP: 109/68 103/63  Pulse: (!) 154 66  Resp:  18  Temp:      Last Pain:  Vitals:   07/10/16 1430  TempSrc:   PainSc: 0-No pain      Patients Stated Pain Goal: 4 (07/10/16 0503)  Complications: No apparent anesthesia complications

## 2016-07-10 NOTE — H&P (Signed)
LABOR AND DELIVERY ADMISSION HISTORY AND PHYSICAL NOTE  Courtney Reese is a 26 y.o. female [phone] with IUP at [redacted]w[redacted]d by 1st trimester ultrasound presenting for IOL secondary to post dates. She is reporting feeling regular contractions, but reports that they are not very intense at this time.   She reports positive fetal movement. She denies leakage of fluid or vaginal bleeding.  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 Anemia   . Hemorrhoids in pregnancy   . Hx: UTI (urinary tract infection)   . Late prenatal care   . Vaginal Pap smear, abnormal     Past Surgical History: Past Surgical History:  Procedure Laterality Date  . NO PAST SURGERIES      Obstetrical History: OB History    Gravida Para Term Preterm AB Living   5 4 4     4    SAB TAB Ectopic Multiple Live Births         0 4      Social History: Social History   Social History  . Marital status: Single    Spouse name: N/A  . Number of children: N/A  . Years of education: N/A   Social History Main Topics  . Smoking status: Former Smoker    Quit date: 10/22/2012  . Smokeless tobacco: Never Used  . Alcohol use No  . Drug use: Yes    Types: Marijuana     Comment: +UDS 03/26/14  . Sexual activity: Not Asked   Other Topics Concern  . None   Social History Narrative  . None    Family History: Family History  Problem Relation Age of Onset  . Hypertension Maternal Grandfather   . Diabetes Maternal Grandfather     Allergies: No Known Allergies  Prescriptions Prior to Admission  Medication Sig Dispense Refill Last Dose  . ibuprofen (ADVIL,MOTRIN) 600 MG tablet Take 1 tablet (600 mg total) by mouth every 6 (six) hours. 30 tablet 0   . Prenatal Vit-Fe Fumarate-FA  (PRENATAL MULTIVITAMIN) TABS tablet Take 1 tablet by mouth daily at 12 noon.   08/06/2014 at Unknown time     Review of Systems   All systems reviewed and negative except as stated in HPI  unknown if currently breastfeeding. General appearance: alert, cooperative and no distress Lungs: clear to auscultation bilaterally Heart: regular rate and rhythm Abdomen: soft, non-tender; bowel sounds normal Extremities: No calf swelling or tenderness Presentation: cephalic Fetal monitoring: Baseline in the 120s, +accels, -decels, mod variability Uterine activity: ctx q4-6 min     Prenatal labs: ABO, Rh:  B positive Antibody:  negative Rubella: Immune RPR:   negative HBsAg:   negative HIV:   negative GBS:   negative 1 hr Glucola: normal Genetic screening:  normal Anatomy US: normal, borderline AFI  Prenatal Transfer Tool  Maternal Diabetes: No Genetic Screening: Normal Maternal Ultrasounds/Referrals: Normal Fetal Ultrasounds or other Referrals:  None Maternal Substance Abuse:  Yes:  Type: Smoker, Marijuana, Other: Prior to pregnancy Significant Maternal Medications:  None Significant Maternal Lab Results: Lab values include: Group B Strep negative  No results found for this or any previous visit (from the past 24 hour(s)).  Patient Active Problem List   Diagnosis Date Noted  . Pregnant and not yet delivered 07/10/2016  .  Post term pregnancy, 41 weeks 08/06/2014  . Post-term pregnancy, 40-42 weeks of gestation   . [redacted] weeks gestation of pregnancy     Assessment: Courtney Reese is a 26 y.o. [id number] at 2152w0d here for IOL 2/2 post dates. Pregnancy complicated by borderline AFI on ultrasound, late prenatal care, and varicella non-immune status.  #labor: Admitted for induction. Bishop score of 2. Will start with cytotec 25mg  vaginally x 1. Will re-evaluate cervical exam in 4 hrs and determine if additional dose of cytotec is warranted, or if we should progress to pitocin.   #Pain: Desires an epidural when appropriate.  #FWB: Cat I #ID:  GBS negative- no abx ppx indicated #MOF: Bottle #MOC:Desired BTL. Papers signed 05/16/16.  #Circ:  N/A   Lise AuerMegan C Campbell, MD PGY-2 07/10/2016, 12:59 AM

## 2016-07-10 NOTE — Op Note (Signed)
Courtney RippleKeyria M Reese 07/10/2016  PREOPERATIVE DIAGNOSIS:  Multiparity, undesired fertility  POSTOPERATIVE DIAGNOSIS:  Multiparity, undesired fertility  PROCEDURE:  Postpartum Bilateral Tubal Sterilization using Filshie Clips   ANESTHESIA:  Epidural  COMPLICATIONS:  None immediate.  ESTIMATED BLOOD LOSS:  Less than 20 ml.  INDICATIONS: 26 y.o. [id number]  with undesired fertility,status post vaginal delivery, desires permanent sterilization. Risks and benefits of procedure discussed with patient including permanence of method, bleeding, infection, injury to surrounding organs and need for additional procedures. Risk failure of 0.5-1% with increased risk of ectopic gestation if pregnancy occurs was also discussed with patient.   FINDINGS:  Normal uterus, tubes, and ovaries.  TECHNIQUE:  The patient was taken to the operating room where her epidural anesthesia was dosed up to surgical level and found to be adequate.  She was then placed in the dorsal supine position and prepped and draped in sterile fashion.  After an adequate timeout was performed, attention was turned to the patient's abdomen where a small transverse skin incision was made under the umbilical fold. The incision was taken down to the layer of fascia using the scalpel, and fascia was incised, and extended bilaterally using Mayo scissors. The peritoneum was entered in a sharp fashion. Attention was then turned to the patient's uterus, and left fallopian tube was identified and followed out to the fimbriated end.  A Filshie clip was placed on the left fallopian tube about 2 cm from the cornual attachment, with care given to incorporate the underlying mesosalpinx.  A similar process was carried out on the rightl side allowing for bilateral tubal sterilization.  Good hemostasis was noted overall.  Local analgesia was drizzled on both operative sites.The instruments were then removed from the patient's abdomen and the fascial incision was repaired  with 0 Vicryl, and the skin was closed with a 3-0 Monocryl subcuticular stitch. The patient tolerated the procedure well.  Sponge, lap, and needle counts were correct times two.  The patient was then taken to the recovery room awake, extubated and in stable condition.   Ernestina PennaNicholas Schenk, MD 07/10/2016 1600

## 2016-07-10 NOTE — Anesthesia Preprocedure Evaluation (Signed)
Anesthesia Evaluation  Patient identified by MRN, date of birth, ID band Patient awake    Reviewed: Allergy & Precautions, NPO status , Patient's Chart, lab work & pertinent test results  Airway Mallampati: II  TM Distance: >3 FB Neck ROM: Full    Dental no notable dental hx.    Pulmonary neg pulmonary ROS, former smoker,    Pulmonary exam normal breath sounds clear to auscultation       Cardiovascular negative cardio ROS Normal cardiovascular exam Rhythm:Regular Rate:Normal     Neuro/Psych negative neurological ROS  negative psych ROS   GI/Hepatic negative GI ROS, Neg liver ROS,   Endo/Other  negative endocrine ROS  Renal/GU negative Renal ROS     Musculoskeletal negative musculoskeletal ROS (+)   Abdominal   Peds  Hematology negative hematology ROS (+)   Anesthesia Other Findings   Reproductive/Obstetrics negative OB ROS                                                             Anesthesia Evaluation  Patient identified by MRN, date of birth, ID band Patient awake    Reviewed: Allergy & Precautions, H&P , Patient's Chart, lab work & pertinent test results  Airway Mallampati: II  TM Distance: >3 FB Neck ROM: full    Dental   Pulmonary former smoker,    breath sounds clear to auscultation       Cardiovascular negative cardio ROS   Rhythm:regular Rate:Normal     Neuro/Psych negative neurological ROS  negative psych ROS   GI/Hepatic negative GI ROS, Neg liver ROS,   Endo/Other  negative endocrine ROS  Renal/GU negative Renal ROS     Musculoskeletal   Abdominal   Peds  Hematology  (+) anemia ,   Anesthesia Other Findings   Reproductive/Obstetrics (+) Pregnancy                            Anesthesia Physical  Anesthesia Plan  ASA: II  Anesthesia Plan: Epidural   Post-op Pain Management:    Induction:   Airway  Management Planned:   Additional Equipment:   Intra-op Plan:   Post-operative Plan:   Informed Consent: I have reviewed the patients History and Physical, chart, labs and discussed the procedure including the risks, benefits and alternatives for the proposed anesthesia with the patient or authorized representative who has indicated his/her understanding and acceptance.     Plan Discussed with:   Anesthesia Plan Comments:         Anesthesia Quick Evaluation  Anesthesia Physical Anesthesia Plan  ASA: II  Anesthesia Plan: Epidural   Post-op Pain Management:    Induction:   Airway Management Planned:   Additional Equipment:   Intra-op Plan:   Post-operative Plan:   Informed Consent: I have reviewed the patients History and Physical, chart, labs and discussed the procedure including the risks, benefits and alternatives for the proposed anesthesia with the patient or authorized representative who has indicated his/her understanding and acceptance.   Dental advisory given  Plan Discussed with: CRNA  Anesthesia Plan Comments:         Anesthesia Quick Evaluation

## 2016-07-10 NOTE — Anesthesia Postprocedure Evaluation (Signed)
Anesthesia Post Note  Patient: Courtney Reese  Procedure(s) Performed: Procedure(s) (LRB): POST PARTUM TUBAL LIGATION (Bilateral)  Patient location during evaluation: Mother Baby Anesthesia Type: Epidural Level of consciousness: awake and alert Pain management: satisfactory to patient Vital Signs Assessment: post-procedure vital signs reviewed and stable Respiratory status: respiratory function stable Cardiovascular status: stable Postop Assessment: no headache, no backache, epidural receding, patient able to bend at knees, no signs of nausea or vomiting and adequate PO intake Anesthetic complications: no        Last Vitals:  Vitals:   07/10/16 1759 07/10/16 1900  BP: 108/69 (!) 109/57  Pulse: 87 90  Resp: 18 16  Temp: 37.2 C 37.2 C    Last Pain:  Vitals:   07/10/16 1945  TempSrc:   PainSc: 5    Pain Goal: Patients Stated Pain Goal: 4 (07/10/16 0503)               [name],[name]

## 2016-07-10 NOTE — Anesthesia Procedure Notes (Signed)
Epidural Patient location during procedure: OB Start time: 07/10/2016 8:00 AM End time: 07/10/2016 8:07 AM  Staffing Anesthesiologist: Lewie LoronGERMEROTH, Caydance Kuehnle Performed: anesthesiologist   Preanesthetic Checklist Completed: patient identified, pre-op evaluation, timeout performed, IV checked, risks and benefits discussed and monitors and equipment checked  Epidural Patient position: sitting Prep: site prepped and draped and DuraPrep Patient monitoring: heart rate, continuous pulse ox and blood pressure Approach: midline Location: L3-L4 Injection technique: LOR air and LOR saline  Needle:  Needle type: Tuohy  Needle gauge: 17 G Needle length: 9 cm Needle insertion depth: 6 cm Catheter type: closed end flexible Catheter size: 19 Gauge Catheter at skin depth: 12 cm Test dose: negative  Assessment Sensory level: T8 Events: blood not aspirated, injection not painful, no injection resistance, negative IV test and no paresthesia  Additional Notes Reason for block:procedure for pain

## 2016-07-10 NOTE — Anesthesia Postprocedure Evaluation (Signed)
Anesthesia Post Note  Patient: Courtney Reese  Procedure(s) Performed: * No procedures listed *  Patient location during evaluation: Mother Baby Anesthesia Type: Epidural Level of consciousness: awake and alert Pain management: satisfactory to patient Vital Signs Assessment: post-procedure vital signs reviewed and stable Respiratory status: respiratory function stable Cardiovascular status: stable Postop Assessment: no headache, no backache, epidural receding, patient able to bend at knees, no signs of nausea or vomiting and adequate PO intake Anesthetic complications: no        Last Vitals:  Vitals:   07/10/16 1759 07/10/16 1900  BP: 108/69 (!) 109/57  Pulse: 87 90  Resp: 18 16  Temp: 37.2 C 37.2 C    Last Pain:  Vitals:   07/10/16 1945  TempSrc:   PainSc: 5    Pain Goal: Patients Stated Pain Goal: 4 (07/10/16 0503)               [name],Penney Domanski

## 2016-07-10 NOTE — Anesthesia Pain Management Evaluation Note (Signed)
  CRNA Pain Management Visit Note  Patient: Courtney Reese, 26 y.o., female  "Hello I am a member of the anesthesia team at [facility]. We have an anesthesia team available at all times to provide care throughout the hospital, including epidural management and anesthesia for C-section. I don't know your plan for the delivery whether it a natural birth, water birth, IV sedation, nitrous supplementation, doula or epidural, but we want to meet your pain goals."   1.Was your pain managed to your expectations on prior hospitalizations?  No prior hospitalizations.  2.What is your expectation for pain management during this hospitalization?Epidural.    How  can we help you reach that goal?.Epidural soon. Record the patient's initial score and the patient's pain goal.   Pain: 6  Pain Goal: 7 [facility] wants you to be able to say your pain was always managed very well.  Artemisia Auvil 07/10/2016

## 2016-07-10 NOTE — Progress Notes (Signed)
26 y.o. [id number] with undesired fertility,status post vaginal delivery, desires permanent sterilization. Risks and benefits of procedure discussed with patient including permanence of method, bleeding, infection, injury to surrounding organs and need for additional procedures. Risk failure of 0.5-1% with increased risk of ectopic gestation if pregnancy occurs was also discussed with patient. Patient verbalized understanding and all questions were answered.  Currie ParisJames G. Debroah LoopArnold MD 07/10/2016 12:34 PM

## 2016-07-11 NOTE — Progress Notes (Signed)
POSTPARTUM PROGRESS NOTE  Post Partum Day 1/ Post operative Day 1  Subjective:  Courtney Reese is a 26 y.o. [id number] 8380w1d s/p SVD after induction of labor secondary to post dates .  No acute events overnight.  Pt denies problems with ambulating, voiding or po intake.  She denies nausea or vomiting.  Pain is moderately controlled.  She has had flatus. She has not had bowel movement.  Lochia Small.   Objective: Blood pressure (!) 101/49, pulse 78, temperature 98.4 F (36.9 C), temperature source Oral, resp. rate 18, height 5\' 2"  (1.575 m), weight 152 lb (68.9 kg), SpO2 98 %, unknown if currently breastfeeding.  Physical Exam:  General: alert, cooperative and no distress Lochia:normal flow Chest: CTAB Heart: RRR no m/r/g Abdomen: +BS, soft, nontender,  Uterine Fundus: firm, palpable just inferior to the umbilcus DVT Evaluation: No calf swelling or tenderness Extremities: no lower extremity edema   Recent Labs  07/10/16 0135  HGB 10.8*  HCT 33.5*    Assessment/Plan:  ASSESSMENT: Courtney Reese is a 26 y.o. [id number] 7680w1d s/p SVD after IOL 2/2 post dates as well as BTL.   Patient is doing well, but is concerned about pain control and would like to stay another day to ensure continued improvement in pain.   Plan for discharge tomorrow   LOS: 1 day   Gorden HarmsMegan Campbell, MD PGY-2 07/11/2016, 8:52 AM   OB FELLOW POSTPARTUM PROGRESS NOTE ATTESTATION  I have seen and examined this patient and agree with above documentation in the resident's note.   Ernestina PennaNicholas Schenk, MD 9:04 AM

## 2016-07-11 NOTE — Progress Notes (Signed)
UR chart review completed.  

## 2016-07-11 NOTE — Clinical Social Work Maternal (Signed)
CLINICAL SOCIAL WORK MATERNAL/CHILD NOTE  Patient Details  Name: Courtney Reese MRN: [id number] Date of Birth: 05/13/1991  Date:  Feb 15, 2017  Clinical Social Worker Initiating Note:  Laurey Arrow Date/ Time Initiated:  01-05-17/1322     Child's Name:  Tiajuana Amass   Legal Guardian:  Mother (FOB is Lelon Perla)   Need for Interpreter:  None   Date of Referral:  2016/08/17     Reason for Referral:  Late or No Prenatal Care    Referral Source:  Central Nursery   Address:  [address] [place] [address]  Phone number:  [phone]   Household Members:  Self, Siblings, Significant Other, Minor Children   Natural Supports (not living in the home):  Immediate Family, Extended Family   Professional Supports: None   Employment: Unemployed   Type of Work:     Education:  9 to 11 years   Pensions consultant:  [employer]   Other Resources:  [employer], Physicist, medical    Cultural/Religious Considerations Which May Impact Care:  Per [employer] Sheet, MOB is Non-Denominational.  Strengths:  Ability to meet basic needs , Pediatrician chosen , Home prepared for child    Risk Factors/Current Problems:  Substance Use , Family/Relationship Issues    Cognitive State:  Alert , Able to Concentrate , Linear Thinking , Insightful    Mood/Affect:  Calm , Relaxed , Interested , Comfortable    CSW Assessment:  CSW met with MOB to complete an assessment for late/limited PNC, and hx of THC use.  MOB was inviting, polite, and interested in meeting with CSW. When CSW arrived, MOB was resting in bed and infant was asleep in bassinet. CSW explained CSW role and inquired about MOB's security concerns with FOB.MOB explained that MOB asked for FOB Lelon Perla) not to be allowed to visit with MOB and infant due to his anger about the baby's name. MOB communicated that FOB became upset when MOB decided not to name infant after FOB's mother.  FOB attempted to snatch hospital phone out  of MOB's hand and started to utilized foul language. Per MOB, FOB was asked to leave the hospital's property.  CSW assessed MOB for safety concerns and MOB denied feeling unsafe.  CSW offered MOB resources for the [facility] and MOB declined information.  MOB denied hx of DV, however, CSW encouraged MOB to call [employer] if MOB's and infant safety is at risk.  MOB actively listen to CSW as CSW explained the effects DV have on infant/children. CSW inquired about MOB's late/limited PNC.  MOB communicated that MOB was residing in [facility] during pregnancy and experienced difficulty returning back to [place] for routine care.  MOB denied barriers to attend follow-up appointments for MOB and infant.  MOB reported that MOB has recently moved back to [place] and can utilize public transportation to attend appointments if needed.  CSW also suggested  MOB to contact Medicaid transportation for upcoming doctor appointments.   CSW inquired about MOB's substance use.  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 CSW offered MOB SA supports and resources and MOB declined.  CSW explained to MOB the hospital's policy and procedure regarding substance use. CSW informed MOB of the two screenings for the infant. CSW informed MOB that the infant's UDS was negative and CSW will continue to monitor infant's CDS. CSW made MOB aware that if infant's CDS is positive without an explanation, CSW will  make a report to [facility] CPS. MOB understood and appeared unaffected. MOB did not have any questions regarding the hospital's policy. MOB admitted to CPS involvement about 6 years ago when MOB had a positive screening during the birth of one of MOB's older children. CSW also made MOB aware that if the infant's CDS in negative, CSW will make a referral to CPS and CPS will offer MOB some additional services. CSW thanked MOB for meeting with CSW and  provided MOB with CSW contact information.   CSW Plan/Description:  Information/Referral to [employer] , [employer] , No Further Intervention Required/No Barriers to Discharge (CSW will monitor infants CDS and will make a report to [place] if warranted. )   Laurey Arrow, MSW, LCSW Clinical Social Work [phone]    Dimple Nanas, LCSW 07/11/2016, 1:27 PM

## 2016-07-12 ENCOUNTER — Encounter (HOSPITAL_COMMUNITY): Payer: Self-pay | Admitting: Obstetrics & Gynecology

## 2016-07-12 DIAGNOSIS — Z9851 Tubal ligation status: Secondary | ICD-10-CM

## 2016-07-12 DIAGNOSIS — O99323 Drug use complicating pregnancy, third trimester: Secondary | ICD-10-CM

## 2016-07-12 DIAGNOSIS — O480 Post-term pregnancy: Secondary | ICD-10-CM

## 2016-07-12 DIAGNOSIS — Z3A41 41 weeks gestation of pregnancy: Secondary | ICD-10-CM

## 2016-07-12 DIAGNOSIS — O418X30 Other specified disorders of amniotic fluid and membranes, third trimester, not applicable or unspecified: Secondary | ICD-10-CM

## 2016-07-12 MED ORDER — IBUPROFEN 600 MG PO TABS: 600.0000 mg | ORAL_TABLET | Freq: Four times a day (QID) | ORAL | 0 refills | Status: DC

## 2016-07-12 MED ORDER — SENNOSIDES-DOCUSATE SODIUM 8.6-50 MG PO TABS: 2.0000 | ORAL_TABLET | ORAL | 0 refills | Status: DC

## 2016-07-12 MED ORDER — OXYCODONE-ACETAMINOPHEN 5-325 MG PO TABS: 1.0000 | ORAL_TABLET | ORAL | 0 refills | Status: DC | PRN

## 2016-07-12 NOTE — Discharge Instructions (Signed)

## 2016-07-12 NOTE — Discharge Summary (Signed)
OB Discharge Summary     Patient Name: Courtney Reese DOB: Jul 09, 1990 MRN: [id number]  Date of admission: 07/10/2016 Delivering MD: Renne Musca   Date of discharge: 07/12/2016  Admitting diagnosis: 41 wk induction desires post partum tubal Intrauterine pregnancy: [redacted]w[redacted]d     Secondary diagnosis:  Principal Problem:   SVD (spontaneous vaginal delivery) Active Problems:   Pregnant and not yet delivered   Status post tubal ligation  Additional problems:  Post dates induction     Discharge diagnosis: Term Pregnancy Delivered                                                                                                Post partum procedures:postpartum tubal ligation  Augmentation: AROM and Cytotec  Complications: None  Hospital course:  Induction of Labor With Vaginal Delivery   26 y.o. yo [id number] at [redacted]w[redacted]d was admitted to the hospital 07/10/2016 for induction of labor.  Indication for induction: Postdates.  Patient had an uncomplicated labor course as follows: Membrane Rupture Time/Date: 10:24 AM ,07/10/2016   Intrapartum Procedures: Episiotomy: None [1]                                         Lacerations:     Patient had delivery of a Viable infant.  Information for the patient's newborn:  Zaakirah, Kistner [[id number]]  Delivery Method: Vaginal, Spontaneous Delivery (Filed from Delivery Summary)   07/10/2016  Details of delivery can be found in separate delivery note.  Patient had a routine postpartum course. Patient is discharged home 07/12/16.  Physical exam  Vitals:   07/11/16 0730 07/11/16 1130 07/11/16 1914 07/12/16 0616  BP: (!) 101/49 118/76 119/72 104/65  Pulse: 78 92 100 97  Resp: 18 18 18 18   Temp: 98.4 F (36.9 C)  98.1 F (36.7 C) 98.2 F (36.8 C)  TempSrc: Oral  Oral Oral  SpO2: 98%     Weight:      Height:       General: alert, cooperative and no distress Lochia: appropriate Uterine Fundus: firm Incision: Healing well with no significant  drainage, Honey comb dressing in place DVT Evaluation: No evidence of DVT seen on physical exam. Negative Homan's sign. No significant calf/ankle edema. Labs: Lab Results  Component Value Date   WBC 8.7 07/10/2016   HGB 10.8 (L) 07/10/2016   HCT 33.5 (L) 07/10/2016   MCV 76.7 (L) 07/10/2016   PLT 225 07/10/2016   No flowsheet data found.  Discharge instruction: per After Visit Summary and "Baby and Me Booklet".  After visit meds:  Allergies as of 07/12/2016   No Known Allergies     Medication List    TAKE these medications   ibuprofen 600 MG tablet Commonly known as:  ADVIL,MOTRIN Take 1 tablet (600 mg total) by mouth every 6 (six) hours.   oxyCODONE-acetaminophen 5-325 MG tablet Commonly known as:  ROXICET Take 1-2 tablets by mouth every 4 (four) hours as needed for severe pain.  prenatal multivitamin Tabs tablet Take 1 tablet by mouth daily at 12 noon.   senna-docusate 8.6-50 MG tablet Commonly known as:  Senokot-S Take 2 tablets by mouth daily. Start taking on:  07/13/2016       Diet: routine diet  Activity: Advance as tolerated. Pelvic rest for 6 weeks.   Outpatient follow up:6 weeks Follow up Appt:No future appointments. Follow up Visit:No Follow-up on file.  Postpartum contraception: BTL  Newborn Data: Live born female  Birth Weight: 7 lb 13.2 oz (3550 g) APGAR: 9, 9  Baby Feeding: Bottle Disposition:home with mother   07/12/2016 Gorden HarmsMegan Campbell, MD  OB FELLOW DISCHARGE ATTESTATION  I have seen and examined this patient and agree with above documentation in the resident's note.   Jen MowElizabeth Durelle Zepeda, DO OB Fellow 11:01 AM

## 2016-07-15 NOTE — H&P (Signed)
LABOR AND DELIVERY ADMISSION HISTORY AND PHYSICAL NOTE  Courtney Reese is a 26 y.o. female G5P4004 with IUP at [redacted]w[redacted]d by 1st trimester ultrasound presenting for IOL secondary to post dates. She is reporting feeling regular contractions, but reports that they are not very intense at this time.   She reports positive fetal movement. She denies leakage of fluid or vaginal bleeding.  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 Anemia   . Hemorrhoids in pregnancy   . Hx: UTI (urinary tract infection)   . Late prenatal care   . Vaginal Pap smear, abnormal     Past Surgical History: Past Surgical History:  Procedure Laterality Date  . NO PAST SURGERIES      Obstetrical History: OB History    Gravida Para Term Preterm AB Living   5 4 4     4   SAB TAB Ectopic Multiple Live Births         0 4      Social History: Social History   Social History  . Marital status: Single    Spouse name: N/A  . Number of children: N/A  . Years of education: N/A   Social History Main Topics  . Smoking status: Former Smoker    Quit date: 10/22/2012  . Smokeless tobacco: Never Used  . Alcohol use No  . Drug use: Yes    Types: Marijuana     Comment: +UDS 03/26/14  . Sexual activity: Not Asked   Other Topics Concern  . None   Social History Narrative  . None    Family History: Family History  Problem Relation Age of Onset  . Hypertension Maternal Grandfather   . Diabetes Maternal Grandfather     Allergies: No Known Allergies  Prescriptions Prior to Admission  Medication Sig Dispense Refill Last Dose  . ibuprofen (ADVIL,MOTRIN) 600 MG tablet Take 1 tablet (600 mg total) by mouth every 6 (six) hours. 30 tablet 0   . Prenatal Vit-Fe Fumarate-FA  (PRENATAL MULTIVITAMIN) TABS tablet Take 1 tablet by mouth daily at 12 noon.   08/06/2014 at Unknown time     Review of Systems   All systems reviewed and negative except as stated in HPI  unknown if currently breastfeeding. General appearance: alert, cooperative and no distress Lungs: clear to auscultation bilaterally Heart: regular rate and rhythm Abdomen: soft, non-tender; bowel sounds normal Extremities: No calf swelling or tenderness Presentation: cephalic Fetal monitoring: Baseline in the 120s, +accels, -decels, mod variability Uterine activity: ctx q4-6 min     Prenatal labs: ABO, Rh:  B positive Antibody:  negative Rubella: Immune RPR:   negative HBsAg:   negative HIV:   negative GBS:   negative 1 hr Glucola: normal Genetic screening:  normal Anatomy US: normal, borderline AFI  Prenatal Transfer Tool  Maternal Diabetes: No Genetic Screening: Normal Maternal Ultrasounds/Referrals: Normal Fetal Ultrasounds or other Referrals:  None Maternal Substance Abuse:  Yes:  Type: Smoker, Marijuana, Other: Prior to pregnancy Significant Maternal Medications:  None Significant Maternal Lab Results: Lab values include: Group B Strep negative  No results found for this or any previous visit (from the past 24 hour(s)).  Patient Active Problem List   Diagnosis Date Noted  . Pregnant and not yet delivered 07/10/2016  .   Post term pregnancy, 41 weeks 08/06/2014  . Post-term pregnancy, 40-42 weeks of gestation   . [redacted] weeks gestation of pregnancy     Assessment: Courtney Reese is a 26 y.o. G5P4004 at [redacted]w[redacted]d here for IOL 2/2 post dates. Pregnancy complicated by borderline AFI on ultrasound, late prenatal care, and varicella non-immune status.  #labor: Admitted for induction. Bishop score of 2. Will start with cytotec 25mg vaginally x 1. Will re-evaluate cervical exam in 4 hrs and determine if additional dose of cytotec is warranted, or if we should progress to pitocin.   #Pain: Desires an epidural when appropriate.  #FWB: Cat I #ID:  GBS negative- no abx ppx indicated #MOF: Bottle #MOC:Desired BTL. Papers signed 05/16/16.  #Circ:  N/A   Megan C Campbell, MD PGY-2 07/10/2016, 12:59 AM      

## 2016-07-20 ENCOUNTER — Encounter (HOSPITAL_COMMUNITY): Payer: Self-pay

## 2016-12-22 NOTE — Anesthesia Postprocedure Evaluation (Signed)
Anesthesia Post Note  Patient: Courtney Reese  Procedure(s) Performed: Procedure(s) (LRB): POST PARTUM TUBAL LIGATION (Bilateral)     Anesthesia Post Evaluation  Last Vitals:  Vitals:   07/11/16 1914 07/12/16 0616  BP: 119/72 104/65  Pulse: 100 97  Resp: 18 18  Temp: 36.7 C 36.8 C    Last Pain:  Vitals:   07/12/16 1214  TempSrc:   PainSc: 7                  Lewie LoronJohn Lexany Belknap

## 2016-12-22 NOTE — Addendum Note (Signed)
Addendum  created 12/22/16 0909 by Tarina Volk, MD   Sign clinical note    

## 2017-04-16 ENCOUNTER — Encounter (HOSPITAL_COMMUNITY): Payer: Self-pay

## 2018-05-07 ENCOUNTER — Ambulatory Visit (INDEPENDENT_AMBULATORY_CARE_PROVIDER_SITE_OTHER): Payer: Self-pay

## 2018-05-07 ENCOUNTER — Ambulatory Visit (HOSPITAL_COMMUNITY)
Admission: EM | Admit: 2018-05-07 | Discharge: 2018-05-07 | Disposition: A | Discharge status: Home / Self Care | Payer: Self-pay | Attending: Family Medicine | Admitting: Family Medicine

## 2018-05-07 ENCOUNTER — Encounter (HOSPITAL_COMMUNITY): Payer: Self-pay | Admitting: Emergency Medicine

## 2018-05-07 ENCOUNTER — Other Ambulatory Visit: Payer: Self-pay

## 2018-05-07 DIAGNOSIS — M25511 Pain in right shoulder: Secondary | ICD-10-CM

## 2018-05-07 DIAGNOSIS — S43101A Unspecified dislocation of right acromioclavicular joint, initial encounter: Secondary | ICD-10-CM

## 2018-05-07 MED ORDER — IBUPROFEN 800 MG PO TABS: 800.0000 mg | ORAL_TABLET | Freq: Three times a day (TID) | ORAL | 0 refills | Status: DC

## 2018-05-07 MED ORDER — KETOROLAC TROMETHAMINE 60 MG/2ML IM SOLN
60.0000 mg | Freq: Once | INTRAMUSCULAR | Status: AC
  Administered 2018-05-07: 60 mg via INTRAMUSCULAR

## 2018-05-07 MED ORDER — KETOROLAC TROMETHAMINE 60 MG/2ML IM SOLN
INTRAMUSCULAR | Status: AC
  Filled 2018-05-07: qty 2

## 2018-05-07 MED ORDER — HYDROCODONE-ACETAMINOPHEN 5-325 MG PO TABS: 1.0000 | ORAL_TABLET | ORAL | 0 refills | Status: DC | PRN

## 2018-05-07 NOTE — Discharge Instructions (Signed)
Ice to area Wear sling at all times No lifting or use of shoulder Take ibuprofen for moderate pain Take the hydrocodone for severe pain Do not drive on the pain medicine hydrocodone Call today to the orthopedic for follow up
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
## 2018-05-07 NOTE — ED Triage Notes (Signed)
Patient fell this morning, non-descriptive concerning how she fell.  Patient reports having pain in right shoulder and an inability to move shoulder/arm .  Patient can move right elbow.  Patient has positive radial pulse, can move fingers

## 2018-07-26 ENCOUNTER — Ambulatory Visit: Payer: Medicaid Other | Admitting: Emergency Medicine

## 2019-10-24 IMAGING — DX DG SHOULDER 2+V*R*
3 series · 3 of 3 positions shown · non-contrast
Comparison: No recent prior.

CLINICAL DATA: Fall.  Pain.

EXAM:
RIGHT SHOULDER - 2+ VIEW

[shoulder ap]
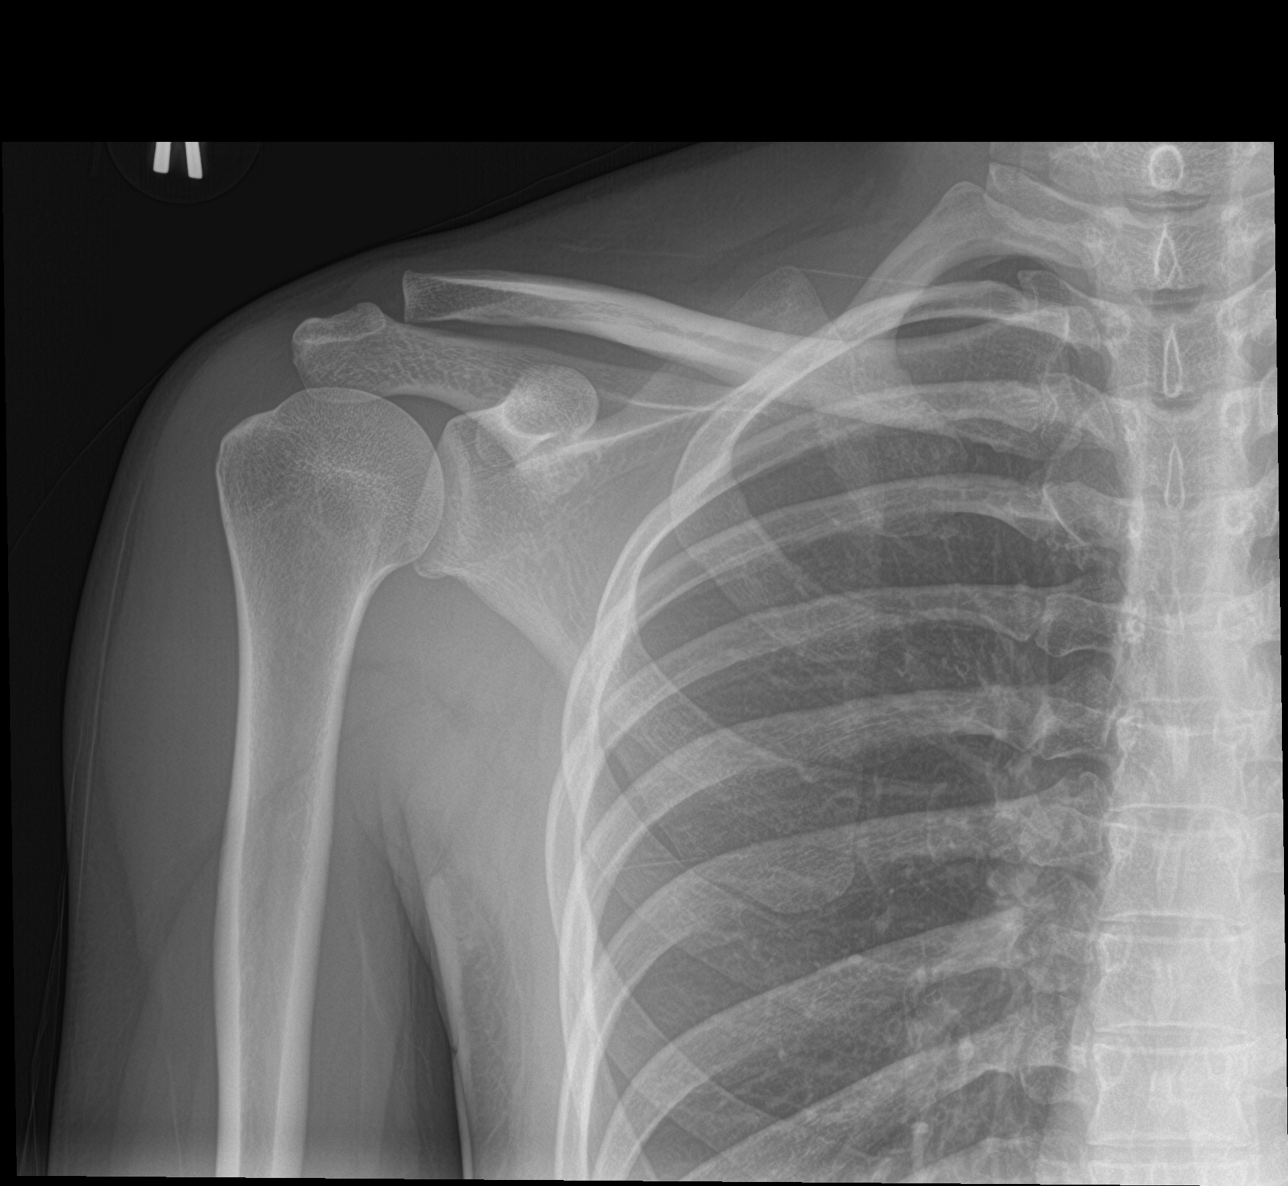

[shoulder grashey]
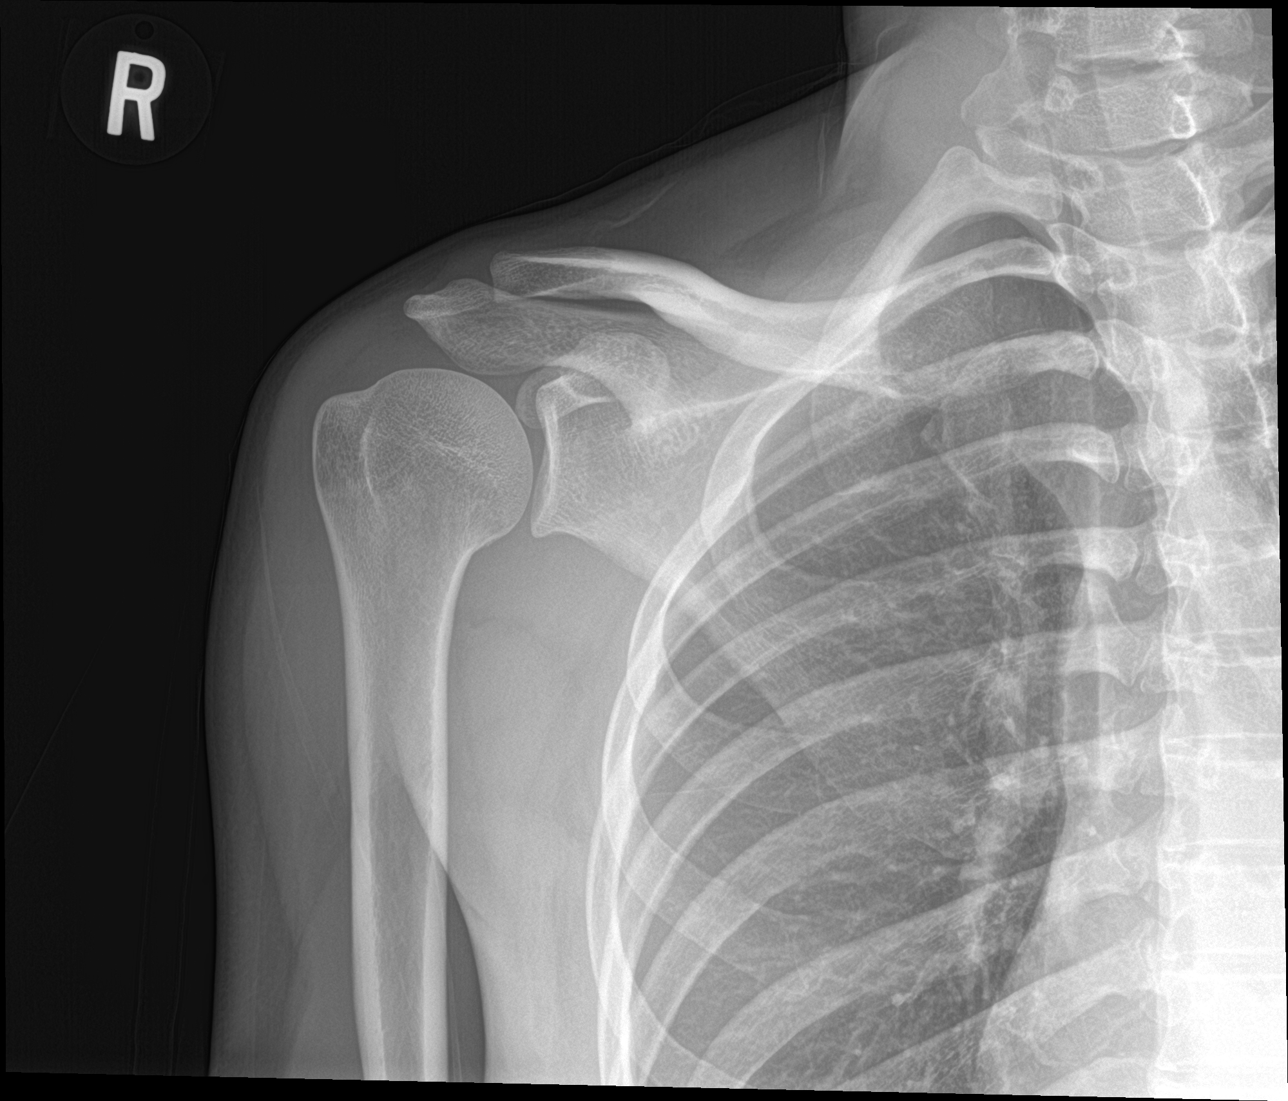

[shoulder y-view]
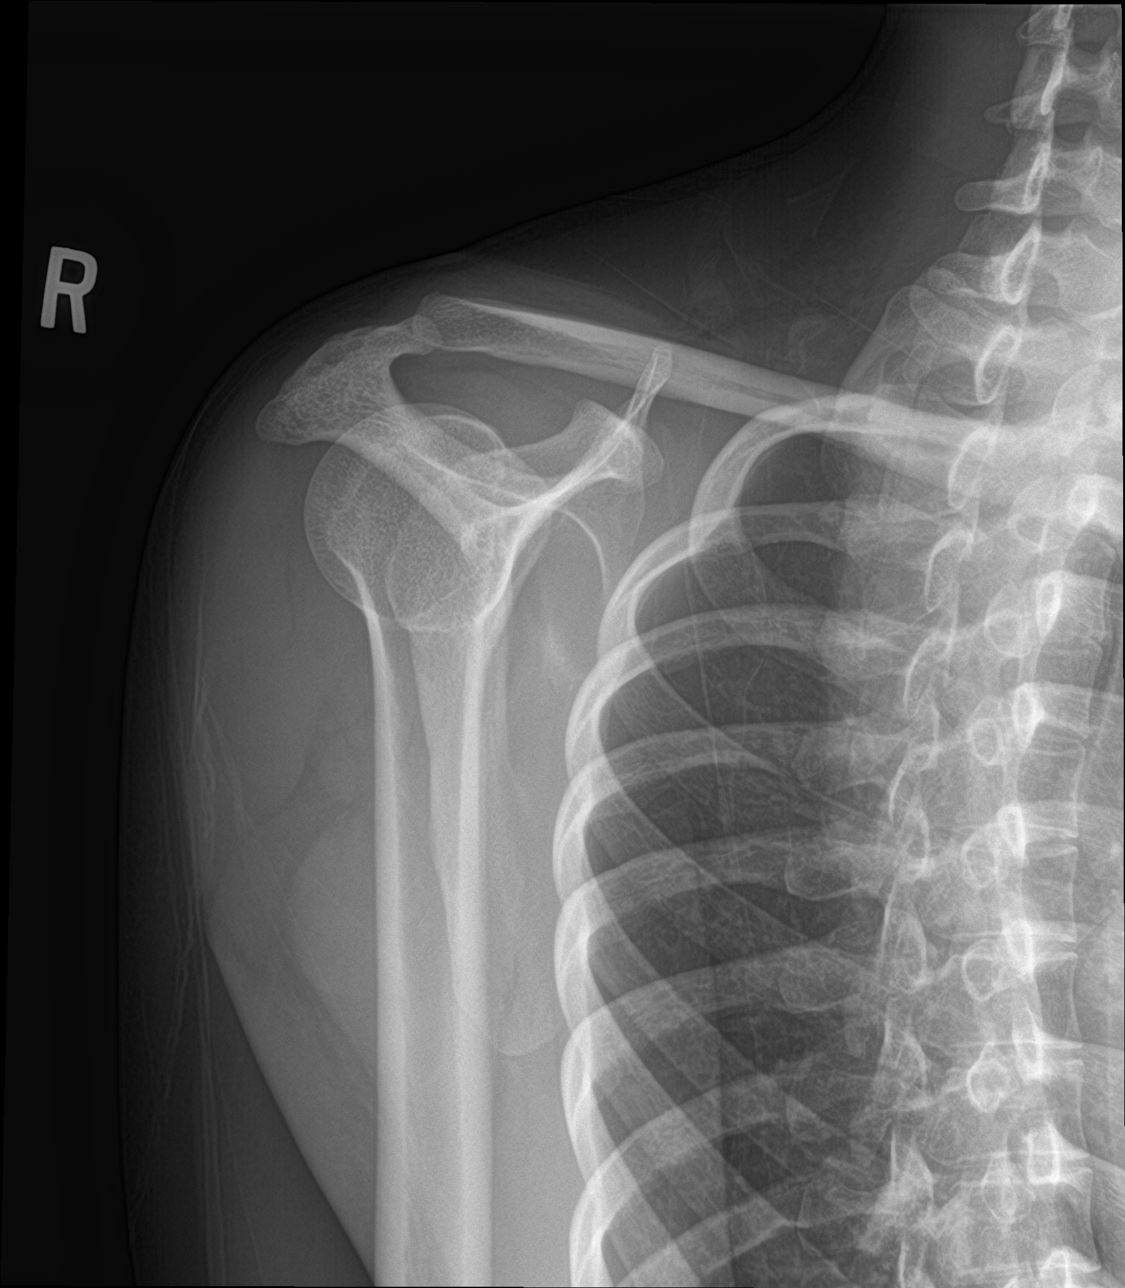

[3 of 3 positions shown; findings below may reference images not displayed]

FINDINGS: No evidence of fracture or separation. Subtle right
acromioclavicular separation cannot be excluded. AP views of both
shoulders with without weights can be obtained for further
evaluation. No acute bony abnormality.
IMPRESSION: Subtle right shoulder separation cannot be completely excluded.
Bilateral shoulder series with without weights can be obtained for
further evaluation as needed. No evidence of fracture or dislocation

## 2019-11-25 ENCOUNTER — Other Ambulatory Visit: Payer: Self-pay

## 2019-11-25 ENCOUNTER — Ambulatory Visit (HOSPITAL_COMMUNITY)
Admission: EM | Admit: 2019-11-25 | Discharge: 2019-11-25 | Disposition: A | Discharge status: Home / Self Care | Payer: Self-pay | Attending: Family Medicine | Admitting: Family Medicine

## 2019-11-25 ENCOUNTER — Encounter (HOSPITAL_COMMUNITY): Payer: Self-pay

## 2019-11-25 DIAGNOSIS — L0291 Cutaneous abscess, unspecified: Secondary | ICD-10-CM

## 2019-11-25 MED ORDER — CEPHALEXIN 500 MG PO CAPS: 500.0000 mg | ORAL_CAPSULE | Freq: Four times a day (QID) | ORAL | 0 refills | Status: AC

## 2019-11-25 NOTE — Discharge Instructions (Addendum)
We opened up the abscess today and drained a lot of infection out. Keep covered Take the antibiotics as prescribed Follow up as needed for continued or worsening symptoms

## 2019-11-25 NOTE — ED Notes (Signed)
Pt did not answer when I call her.

## 2019-11-25 NOTE — ED Provider Notes (Signed)
[facility]    CSN: [id number] Arrival date & time: 11/25/19  1014      History   Chief Complaint Chief Complaint  Patient presents with  . Abscess    HPI Courtney Reese is a 29 y.o. female.   Patient is a 29 year old female who presents today with abscess to left facial area near ear.  Symptoms have been constant and worsening over the past week with increased swelling, tenderness and erythema.  Has been doing warm compresses to treat.  Denies any drainage from the area.  Denies any fever, chills.  Denies any history of MRSA.  ROS per HPI      Past Medical History:  Diagnosis Date  . 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 Anemia   . Hemorrhoids in pregnancy   . Hx: UTI (urinary tract infection)   . Late prenatal care   . Vaginal Pap smear, abnormal     Patient Active Problem List   Diagnosis Date Noted  . SVD (spontaneous vaginal delivery) 07/12/2016  . Status post tubal ligation 07/12/2016  . Pregnant and not yet delivered 07/10/2016  . Post term pregnancy, 41 weeks 08/06/2014  . Post-term pregnancy, 40-42 weeks of gestation   . [redacted] weeks gestation of pregnancy     Past Surgical History:  Procedure Laterality Date  . NO PAST SURGERIES    . TUBAL LIGATION Bilateral 07/10/2016   Procedure: POST PARTUM TUBAL LIGATION;  Surgeon: Woodroe Mode, MD;  Location: [place];  Service: Gynecology;  Laterality: Bilateral;    OB History    Gravida  5   Para  4   Term  4   Preterm      AB      Living  4     SAB      TAB      Ectopic      Multiple  0   Live Births  4            Home Medications    Prior to Admission medications   Medication Sig Start Date End Date Taking? Authorizing Provider  cephALEXin (KEFLEX) 500 MG capsule Take 1 capsule (500 mg total) by mouth 4 (four) times daily. 11/25/19   Orvan July, NP    Family History Family History  Problem Relation Age of Onset  . Hypertension Maternal  Grandfather   . Diabetes Maternal Grandfather     Social History Social History   Tobacco Use  . Smoking status: Former Smoker    Quit date: 10/22/2012    Years since quitting: 7.0  . Smokeless tobacco: Never Used  Substance Use Topics  . Alcohol use: No  . Drug use: Yes    Types: Marijuana    Comment: +UDS 03/26/14     Allergies   Patient has no known allergies.   Review of Systems Review of Systems   Physical Exam Triage Vital Signs ED Triage Vitals  Enc Vitals Group     BP 11/25/19 1115 (!) 115/58     Pulse Rate 11/25/19 1115 69     Resp 11/25/19 1115 15     Temp 11/25/19 1115 98.5 F (36.9 C)     Temp Source 11/25/19 1115 Oral     SpO2 11/25/19 1115 97 %     Weight --      Height --      Head Circumference --      Peak Flow --  Pain Score 11/25/19 1119 2     Pain Loc --      Pain Edu? --      Excl. in GC? --    No data found.  Updated Vital Signs BP (!) 115/58 (BP Location: Right Arm)   Pulse 69   Temp 98.5 F (36.9 C) (Oral)   Resp 15   LMP 11/17/2019 (Exact Date)   SpO2 97%   Visual Acuity Right Eye Distance:   Left Eye Distance:   Bilateral Distance:    Right Eye Near:   Left Eye Near:    Bilateral Near:     Physical Exam Vitals and nursing note reviewed.  Constitutional:      General: She is not in acute distress.    Appearance: Normal appearance. She is not ill-appearing, toxic-appearing or diaphoretic.  HENT:     Head: Normocephalic.      Comments: Approximated 2 cm abscess to left facial area Mostly fluctuant with some surrounding induration, erythema. Mildly tender to palpation.  No drainage    Nose: Nose normal.  Eyes:     Conjunctiva/sclera: Conjunctivae normal.  Pulmonary:     Effort: Pulmonary effort is normal.  Musculoskeletal:        General: Normal range of motion.     Cervical back: Normal range of motion.  Skin:    General: Skin is warm and dry.     Findings: No rash.  Neurological:     Mental Status:  She is alert.  Psychiatric:        Mood and Affect: Mood normal.      UC Treatments / Results  Labs (all labs ordered are listed, but only abnormal results are displayed) Labs Reviewed - No data to display  EKG   Radiology No results found.  Procedures Incision and Drainage  Date/Time: 11/25/2019 12:05 PM Performed by: Janace Aris, NP Authorized by: Janace Aris, NP   Consent:    Consent obtained:  Verbal   Consent given by:  Patient   Risks discussed:  Bleeding, incomplete drainage and pain   Alternatives discussed:  No treatment Location:    Type:  Abscess   Size:  2   Location:  Head   Head location:  Face Pre-procedure details:    Skin preparation:  Antiseptic wash Anesthesia (see MAR for exact dosages):    Anesthesia method:  None Procedure type:    Complexity:  Simple Procedure details:    Needle aspiration: no     Incision types:  Stab incision   Incision depth:  Subcutaneous   Scalpel blade:  11   Drainage:  Purulent   Drainage amount:  Moderate   Wound treatment:  Wound left open   Packing materials:  None   (including critical care time)  Medications Ordered in UC Medications - No data to display  Initial Impression / Assessment and Plan / UC Course  I have reviewed the triage vital signs and the nursing notes.  Pertinent labs & imaging results that were available during my care of the patient were reviewed by me and considered in my medical decision making (see chart for details).     Abscess I&D done here today with moderate amount of purulent discharge Patient tolerated well. Prescribed Keflex for antibiotic coverage Follow up as needed for continued or worsening symptoms  Final Clinical Impressions(s) / UC Diagnoses   Final diagnoses:  Abscess     Discharge Instructions     We opened up  the abscess today and drained a lot of infection out. Keep covered Take the antibiotics as prescribed Follow up as needed for continued  or worsening symptoms     ED Prescriptions    Medication Sig Dispense Auth. Provider   cephALEXin (KEFLEX) 500 MG capsule Take 1 capsule (500 mg total) by mouth 4 (four) times daily. 28 capsule Rabab Currington A, NP     PDMP not reviewed this encounter.   Janace Aris, NP 11/25/19 1206

## 2019-11-25 NOTE — ED Triage Notes (Signed)
Pt presents to UC with an abscess in the left preauricular area  x 1 week. Pt has not tried any medication for this.

## 2020-06-21 DIAGNOSIS — Z3009 Encounter for other general counseling and advice on contraception: Secondary | ICD-10-CM | POA: Diagnosis not present

## 2020-06-21 DIAGNOSIS — Z1388 Encounter for screening for disorder due to exposure to contaminants: Secondary | ICD-10-CM | POA: Diagnosis not present

## 2020-06-21 DIAGNOSIS — Z0389 Encounter for observation for other suspected diseases and conditions ruled out: Secondary | ICD-10-CM | POA: Diagnosis not present

## 2020-10-22 ENCOUNTER — Other Ambulatory Visit: Payer: Self-pay

## 2020-10-22 ENCOUNTER — Ambulatory Visit (HOSPITAL_COMMUNITY)
Admission: EM | Admit: 2020-10-22 | Discharge: 2020-10-22 | Disposition: A | Discharge status: Home / Self Care | Payer: BC Managed Care – PPO | Attending: Emergency Medicine | Admitting: Emergency Medicine

## 2020-10-22 ENCOUNTER — Encounter (HOSPITAL_COMMUNITY): Payer: Self-pay

## 2020-10-22 DIAGNOSIS — F17210 Nicotine dependence, cigarettes, uncomplicated: Secondary | ICD-10-CM | POA: Diagnosis not present

## 2020-10-22 DIAGNOSIS — U071 COVID-19: Secondary | ICD-10-CM | POA: Diagnosis not present

## 2020-10-22 DIAGNOSIS — R519 Headache, unspecified: Secondary | ICD-10-CM | POA: Diagnosis present

## 2020-10-22 DIAGNOSIS — B349 Viral infection, unspecified: Secondary | ICD-10-CM

## 2020-10-22 NOTE — Discharge Instructions (Signed)
You most likely have viral illness.   You can take Tylenol and/or Ibuprofen as needed for fever reduction and pain relief.   For cough: honey 1/2 to 1 teaspoon (you can dilute the honey in water or another fluid).  You can use a humidifier for chest congestion and cough.  If you don't have a humidifier, you can sit in the bathroom with the hot shower running.    For sore throat: try warm salt water gargles, cepacol lozenges, throat spray, warm tea or water with lemon/honey, popsicles or ice, or OTC cold relief medicine for throat discomfort.    For congestion: take a daily anti-histamine like Zyrtec, Claritin, and a oral decongestant.  You can also use Flonase 1-2 sprays in each nostril daily.    It is important to stay hydrated: drink plenty of fluids (water, gatorade/powerade/pedialyte, juices, or teas) to keep your throat moisturized and help further relieve irritation/discomfort.   Return or go to the Emergency Department if symptoms worsen or do not improve in the next few days.

## 2020-10-22 NOTE — ED Provider Notes (Signed)
[facility]    CSN: [id number] Arrival date & time: 10/22/20  1514      History   Chief Complaint Chief Complaint  Patient presents with  . Headache  . Generalized Body Aches    HPI Courtney Reese is a 30 y.o. female.   Patient here for evaluation of body aches, congestion, and headaches that started yesterday.  Reports children have all had cold symptoms but states that she feels worse.  Has not taken any OTC medications or treatments.  Denies any trauma, injury, or other precipitating event.  Denies any specific alleviating or aggravating factors.  Denies any fevers, chest pain, shortness of breath, N/V/D, numbness, tingling, weakness, abdominal pain, or headaches.   ROS: As per HPI, all other pertinent ROS negative   The history is provided by the patient.  Headache Associated symptoms: myalgias     Past Medical History:  Diagnosis Date  . 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 Anemia   . Hemorrhoids in pregnancy   . Hx: UTI (urinary tract infection)   . Late prenatal care   . Vaginal Pap smear, abnormal     Patient Active Problem List   Diagnosis Date Noted  . SVD (spontaneous vaginal delivery) 07/12/2016  . Status post tubal ligation 07/12/2016  . Pregnant and not yet delivered 07/10/2016  . Post term pregnancy, 41 weeks 08/06/2014  . Post-term pregnancy, 40-42 weeks of gestation   . [redacted] weeks gestation of pregnancy     Past Surgical History:  Procedure Laterality Date  . NO PAST SURGERIES    . TUBAL LIGATION Bilateral 07/10/2016   Procedure: POST PARTUM TUBAL LIGATION;  Surgeon: Adam Phenix, MD;  Location: [facility];  Service: Gynecology;  Laterality: Bilateral;    OB History    Gravida  5   Para  4   Term  4   Preterm      AB      Living  4     SAB      IAB      Ectopic      Multiple  0   Live Births  4            Home Medications    Prior to Admission medications   Medication Sig Start Date  End Date Taking? Authorizing Provider  cephALEXin (KEFLEX) 500 MG capsule Take 1 capsule (500 mg total) by mouth 4 (four) times daily. 11/25/19   Janace Aris, NP    Family History Family History  Problem Relation Age of Onset  . Hypertension Maternal Grandfather   . Diabetes Maternal Grandfather     Social History Social History   Tobacco Use  . Smoking status: Current Some Day Smoker    Types: Cigarettes    Last attempt to quit: 10/22/2012    Years since quitting: 8.0  . Smokeless tobacco: Never Used  . Tobacco comment: states trying to quit  Substance Use Topics  . Alcohol use: No  . Drug use: Yes    Types: Marijuana    Comment: +UDS 03/26/14     Allergies   Patient has no known allergies.   Review of Systems Review of Systems  Musculoskeletal: Positive for myalgias.  Neurological: Positive for headaches.  All other systems reviewed and are negative.    Physical Exam Triage Vital Signs ED Triage Vitals  Enc Vitals Group     BP 10/22/20 1609 101/71  Pulse Rate 10/22/20 1609 82     Resp 10/22/20 1609 18     Temp 10/22/20 1609 99.5 F (37.5 C)     Temp src --      SpO2 10/22/20 1609 100 %     Weight --      Height --      Head Circumference --      Peak Flow --      Pain Score 10/22/20 1608 6     Pain Loc --      Pain Edu? --      Excl. in GC? --    No data found.  Updated Vital Signs BP 101/71   Pulse 82   Temp 99.5 F (37.5 C)   Resp 18   LMP 10/16/2020   SpO2 100%   Visual Acuity Right Eye Distance:   Left Eye Distance:   Bilateral Distance:    Right Eye Near:   Left Eye Near:    Bilateral Near:     Physical Exam Vitals and nursing note reviewed.  Constitutional:      General: She is not in acute distress.    Appearance: Normal appearance. She is not ill-appearing, toxic-appearing or diaphoretic.  HENT:     Head: Normocephalic and atraumatic.  Eyes:     Conjunctiva/sclera: Conjunctivae normal.  Cardiovascular:     Rate  and Rhythm: Normal rate and regular rhythm.     Pulses: Normal pulses.     Heart sounds: Normal heart sounds.  Pulmonary:     Effort: Pulmonary effort is normal.     Breath sounds: Normal breath sounds.  Abdominal:     General: Abdomen is flat.  Musculoskeletal:        General: Normal range of motion.     Cervical back: Normal range of motion.  Skin:    General: Skin is warm and dry.  Neurological:     General: No focal deficit present.     Mental Status: She is alert and oriented to person, place, and time.     GCS: GCS eye subscore is 4. GCS verbal subscore is 5. GCS motor subscore is 6.     Cranial Nerves: No dysarthria.     Sensory: No sensory deficit.     Motor: No weakness.     Coordination: Romberg sign negative. Coordination normal.     Gait: Gait normal.  Psychiatric:        Mood and Affect: Mood normal.      UC Treatments / Results  Labs (all labs ordered are listed, but only abnormal results are displayed) Labs Reviewed  SARS CORONAVIRUS 2 (TAT 6-24 HRS)    EKG   Radiology No results found.  Procedures Procedures (including critical care time)  Medications Ordered in UC Medications - No data to display  Initial Impression / Assessment and Plan / UC Course  I have reviewed the triage vital signs and the nursing notes.  Pertinent labs & imaging results that were available during my care of the patient were reviewed by me and considered in my medical decision making (see chart for details).    Assessment negative for red flags or concerns.  Likely viral illness.  Patient requesting COVID test which is pending.  Work note supplied so patient can continue to quarantine while awaiting the results of the test.  Discussed conservative symptom management as described below in discharge instructions.  Follow-up with primary care as needed.  Final Clinical Impressions(s) / UC Diagnoses  Final diagnoses:  Viral illness     Discharge Instructions     You  most likely have viral illness.   You can take Tylenol and/or Ibuprofen as needed for fever reduction and pain relief.   For cough: honey 1/2 to 1 teaspoon (you can dilute the honey in water or another fluid).  You can use a humidifier for chest congestion and cough.  If you don't have a humidifier, you can sit in the bathroom with the hot shower running.    For sore throat: try warm salt water gargles, cepacol lozenges, throat spray, warm tea or water with lemon/honey, popsicles or ice, or OTC cold relief medicine for throat discomfort.    For congestion: take a daily anti-histamine like Zyrtec, Claritin, and a oral decongestant.  You can also use Flonase 1-2 sprays in each nostril daily.    It is important to stay hydrated: drink plenty of fluids (water, gatorade/powerade/pedialyte, juices, or teas) to keep your throat moisturized and help further relieve irritation/discomfort.   Return or go to the Emergency Department if symptoms worsen or do not improve in the next few days.      ED Prescriptions    None     PDMP not reviewed this encounter.   Ivette Loyal, NP 10/22/20 1625

## 2020-10-22 NOTE — ED Triage Notes (Signed)
Pt c/o headache and body aches starting yesterday. States headache is severe and would like a covid test.

## 2020-10-23 LAB — SARS CORONAVIRUS 2 (TAT 6-24 HRS): SARS Coronavirus 2: POSITIVE — AB

## 2020-10-24 ENCOUNTER — Telehealth: Payer: Self-pay | Admitting: Nurse Practitioner

## 2020-10-24 NOTE — Telephone Encounter (Signed)
Called to discuss with patient about COVID-19 symptoms and the use of one of the available treatments for those with mild to moderate Covid symptoms and at a high risk of hospitalization.  Pt appears to qualify for outpatient treatment due to co-morbid conditions and/or a member of an at-risk group in accordance with the FDA Emergency Use Authorization.    Symptom onset: 10/21/2020 Vaccinated: Not on file Booster? Not on file Immunocompromised? No  Qualifiers: AA, smoker  NIH Criteria: 4  Unable to reach pt - Voicemail and Mychart message sent. No recent labs on file.   Willette Alma, NP COVID Treatment Team [phone]

## 2023-06-28 ENCOUNTER — Emergency Department (HOSPITAL_COMMUNITY): Payer: BC Managed Care – PPO

## 2023-06-28 ENCOUNTER — Encounter (HOSPITAL_COMMUNITY): Payer: Self-pay

## 2023-06-28 ENCOUNTER — Other Ambulatory Visit: Payer: Self-pay

## 2023-06-28 ENCOUNTER — Emergency Department (HOSPITAL_COMMUNITY)
Admission: EM | Admit: 2023-06-28 | Discharge: 2023-06-28 | Disposition: A | Discharge status: Home / Self Care | Payer: BC Managed Care – PPO | Attending: Emergency Medicine | Admitting: Emergency Medicine

## 2023-06-28 DIAGNOSIS — S59912A Unspecified injury of left forearm, initial encounter: Secondary | ICD-10-CM | POA: Diagnosis not present

## 2023-06-28 DIAGNOSIS — M79632 Pain in left forearm: Secondary | ICD-10-CM | POA: Diagnosis not present

## 2023-06-28 DIAGNOSIS — S60512A Abrasion of left hand, initial encounter: Secondary | ICD-10-CM

## 2023-06-28 DIAGNOSIS — M79642 Pain in left hand: Secondary | ICD-10-CM | POA: Diagnosis not present

## 2023-06-28 DIAGNOSIS — S299XXA Unspecified injury of thorax, initial encounter: Secondary | ICD-10-CM | POA: Diagnosis not present

## 2023-06-28 DIAGNOSIS — S61412A Laceration without foreign body of left hand, initial encounter: Secondary | ICD-10-CM | POA: Diagnosis not present

## 2023-06-28 DIAGNOSIS — S60222A Contusion of left hand, initial encounter: Secondary | ICD-10-CM | POA: Diagnosis not present

## 2023-06-28 DIAGNOSIS — S3993XA Unspecified injury of pelvis, initial encounter: Secondary | ICD-10-CM | POA: Diagnosis not present

## 2023-06-28 DIAGNOSIS — S6992XA Unspecified injury of left wrist, hand and finger(s), initial encounter: Secondary | ICD-10-CM | POA: Diagnosis present

## 2023-06-28 DIAGNOSIS — Y92410 Unspecified street and highway as the place of occurrence of the external cause: Secondary | ICD-10-CM | POA: Insufficient documentation

## 2023-06-28 LAB — COMPREHENSIVE METABOLIC PANEL
ALT: 22 U/L (ref 0–44)
AST: 40 U/L (ref 15–41)
Albumin: 4.3 g/dL (ref 3.5–5.0)
Alkaline Phosphatase: 41 U/L (ref 38–126)
Anion gap: 13 (ref 5–15)
BUN: 9 mg/dL (ref 6–20)
CO2: 19 mmol/L — ABNORMAL LOW (ref 22–32)
Calcium: 8.4 mg/dL — ABNORMAL LOW (ref 8.9–10.3)
Chloride: 107 mmol/L (ref 98–111)
Creatinine, Ser: 0.46 mg/dL (ref 0.44–1.00)
GFR, Estimated: >60 mL/min (ref >60)
Glucose, Bld: 102 mg/dL — ABNORMAL HIGH (ref 70–99)
Potassium: 4.2 mmol/L (ref 3.5–5.1)
Sodium: 139 mmol/L (ref 135–145)
Total Bilirubin: 0.5 mg/dL (ref 0.0–1.2)
Total Protein: 7.4 g/dL (ref 6.5–8.1)

## 2023-06-28 LAB — CBC WITH DIFFERENTIAL/PLATELET
Abs Immature Granulocytes: 0.04 10*3/uL (ref 0.00–0.07)
Basophils Absolute: 0 10*3/uL (ref 0.0–0.1)
Basophils Relative: 1 %
Eosinophils Absolute: 0 10*3/uL (ref 0.0–0.5)
Eosinophils Relative: 0 %
HCT: 38.2 % (ref 36.0–46.0)
Hemoglobin: 11.7 g/dL — ABNORMAL LOW (ref 12.0–15.0)
Immature Granulocytes: 1 %
Lymphocytes Relative: 25 %
Lymphs Abs: 2.1 10*3/uL (ref 0.7–4.0)
MCH: 25.7 pg — ABNORMAL LOW (ref 26.0–34.0)
MCHC: 30.6 g/dL (ref 30.0–36.0)
MCV: 83.8 fL (ref 80.0–100.0)
Monocytes Absolute: 0.6 10*3/uL (ref 0.1–1.0)
Monocytes Relative: 7 %
Neutro Abs: 5.7 10*3/uL (ref 1.7–7.7)
Neutrophils Relative %: 66 %
Platelets: 243 10*3/uL (ref 150–400)
RBC: 4.56 MIL/uL (ref 3.87–5.11)
RDW: 16 % — ABNORMAL HIGH (ref 11.5–15.5)
WBC: 8.5 10*3/uL (ref 4.0–10.5)
nRBC: 0 % (ref 0.0–0.2)

## 2023-06-28 LAB — HCG, QUANTITATIVE, PREGNANCY: hCG, Beta Chain, Quant, S: <1 m[IU]/mL (ref <5)

## 2023-06-28 MED ORDER — SODIUM CHLORIDE 0.9 % IV BOLUS
1000.0000 mL | Freq: Once | INTRAVENOUS | Status: AC
  Administered 2023-06-28: 1000 mL via INTRAVENOUS

## 2023-06-28 MED ORDER — NAPROXEN 375 MG PO TABS: 375.0000 mg | ORAL_TABLET | Freq: Two times a day (BID) | ORAL | 0 refills | Status: AC

## 2023-06-28 MED ORDER — ACETAMINOPHEN 500 MG PO TABS
1000.0000 mg | ORAL_TABLET | Freq: Once | ORAL | Status: AC
  Administered 2023-06-28: 1000 mg via ORAL
  Filled 2023-06-28: qty 2

## 2023-06-28 MED ORDER — NAPROXEN 250 MG PO TABS: 375.0000 mg | ORAL_TABLET | Freq: Once | ORAL | Status: DC

## 2023-06-28 MED ORDER — MORPHINE SULFATE (PF) 4 MG/ML IV SOLN
4.0000 mg | Freq: Once | INTRAVENOUS | Status: AC
  Administered 2023-06-28: 4 mg via INTRAVENOUS
  Filled 2023-06-28: qty 1
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
## 2023-06-28 NOTE — Discharge Instructions (Signed)
For pain control you may take 1000 mg of Tylenol every 8 hours scheduled.  In addition you can take 1 tablet of Naproxen every 12 hours as needed for pain not controlled with the scheduled Tylenol.
